# Patient Record
Sex: Male | Born: 1960 | Race: White | Hispanic: No | Marital: Single | Smoking: Current some day smoker
Health system: Southern US, Community
[De-identification: ages and names within clinical notes are randomized; demographics above are authoritative.]

## PROBLEM LIST (undated history)

## (undated) DIAGNOSIS — J45909 Unspecified asthma, uncomplicated: Secondary | ICD-10-CM

## (undated) DIAGNOSIS — F329 Major depressive disorder, single episode, unspecified: Secondary | ICD-10-CM

## (undated) DIAGNOSIS — I10 Essential (primary) hypertension: Secondary | ICD-10-CM

## (undated) DIAGNOSIS — R519 Headache, unspecified: Secondary | ICD-10-CM

## (undated) DIAGNOSIS — J449 Chronic obstructive pulmonary disease, unspecified: Secondary | ICD-10-CM

## (undated) DIAGNOSIS — F419 Anxiety disorder, unspecified: Secondary | ICD-10-CM

## (undated) DIAGNOSIS — F32A Depression, unspecified: Secondary | ICD-10-CM

## (undated) DIAGNOSIS — R51 Headache: Secondary | ICD-10-CM

## (undated) HISTORY — DX: Anxiety disorder, unspecified: F41.9

## (undated) HISTORY — PX: HERNIA REPAIR: SHX51

## (undated) HISTORY — PX: FINGER SURGERY: SHX640

---
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
## 1979-03-30 HISTORY — PX: MANDIBLE FRACTURE SURGERY: SHX706

## 1997-11-06 ENCOUNTER — Emergency Department (HOSPITAL_COMMUNITY): Admission: EM | Admit: 1997-11-06 | Discharge: 1997-11-06 | Payer: Self-pay

## 1998-04-08 ENCOUNTER — Encounter: Payer: Self-pay | Admitting: Emergency Medicine

## 1998-04-08 ENCOUNTER — Observation Stay (HOSPITAL_COMMUNITY): Admission: EM | Admit: 1998-04-08 | Discharge: 1998-04-09 | Payer: Self-pay | Admitting: Emergency Medicine

## 1999-01-25 ENCOUNTER — Emergency Department (HOSPITAL_COMMUNITY): Admission: EM | Admit: 1999-01-25 | Discharge: 1999-01-25 | Payer: Self-pay | Admitting: Emergency Medicine

## 2001-10-21 ENCOUNTER — Emergency Department (HOSPITAL_COMMUNITY): Admission: EM | Admit: 2001-10-21 | Discharge: 2001-10-21 | Payer: Self-pay | Admitting: Internal Medicine

## 2001-10-21 ENCOUNTER — Encounter: Payer: Self-pay | Admitting: Internal Medicine

## 2006-04-03 ENCOUNTER — Emergency Department (HOSPITAL_COMMUNITY): Admission: EM | Admit: 2006-04-03 | Discharge: 2006-04-03 | Payer: Self-pay | Admitting: Emergency Medicine

## 2006-04-04 ENCOUNTER — Emergency Department (HOSPITAL_COMMUNITY): Admission: EM | Admit: 2006-04-04 | Discharge: 2006-04-04 | Payer: Self-pay | Admitting: Family Medicine

## 2006-04-05 ENCOUNTER — Emergency Department (HOSPITAL_COMMUNITY): Admission: EM | Admit: 2006-04-05 | Discharge: 2006-04-05 | Payer: Self-pay | Admitting: Family Medicine

## 2007-05-21 ENCOUNTER — Emergency Department (HOSPITAL_COMMUNITY): Admission: EM | Admit: 2007-05-21 | Discharge: 2007-05-21 | Payer: Self-pay | Admitting: Emergency Medicine

## 2008-09-05 ENCOUNTER — Emergency Department (HOSPITAL_COMMUNITY): Admission: EM | Admit: 2008-09-05 | Discharge: 2008-09-05 | Payer: Self-pay | Admitting: Emergency Medicine

## 2008-09-12 ENCOUNTER — Emergency Department (HOSPITAL_COMMUNITY): Admission: EM | Admit: 2008-09-12 | Discharge: 2008-09-12 | Payer: Self-pay | Admitting: Emergency Medicine

## 2009-10-20 ENCOUNTER — Emergency Department (HOSPITAL_COMMUNITY): Admission: EM | Admit: 2009-10-20 | Discharge: 2009-10-21 | Payer: Self-pay | Admitting: Emergency Medicine

## 2010-06-13 LAB — DIFFERENTIAL
Basophils Absolute: 0.1 10*3/uL (ref 0.0–0.1)
Basophils Relative: 1 % (ref 0–1)
Eosinophils Absolute: 0.3 10*3/uL (ref 0.0–0.7)
Monocytes Absolute: 0.9 10*3/uL (ref 0.1–1.0)
Monocytes Relative: 10 % (ref 3–12)
Neutro Abs: 5.2 10*3/uL (ref 1.7–7.7)
Neutrophils Relative %: 57 % (ref 43–77)

## 2010-06-13 LAB — URINE CULTURE
Culture  Setup Time: 201107260229
Culture: NO GROWTH

## 2010-06-13 LAB — URINALYSIS, ROUTINE W REFLEX MICROSCOPIC
Bilirubin Urine: NEGATIVE
Ketones, ur: NEGATIVE mg/dL
Nitrite: NEGATIVE
Protein, ur: NEGATIVE mg/dL
Urobilinogen, UA: 0.2 mg/dL (ref 0.0–1.0)

## 2010-06-13 LAB — POCT I-STAT, CHEM 8
BUN: 7 mg/dL (ref 6–23)
Calcium, Ion: 1.03 mmol/L — ABNORMAL LOW (ref 1.12–1.32)
Creatinine, Ser: 1.2 mg/dL (ref 0.4–1.5)
Hemoglobin: 15.6 g/dL (ref 13.0–17.0)
Sodium: 143 mEq/L (ref 135–145)
TCO2: 25 mmol/L (ref 0–100)

## 2010-06-13 LAB — CBC
MCH: 33.7 pg (ref 26.0–34.0)
MCHC: 34.7 g/dL (ref 30.0–36.0)
RDW: 12.5 % (ref 11.5–15.5)

## 2010-07-06 LAB — CULTURE, ROUTINE-ABSCESS

## 2010-12-06 ENCOUNTER — Emergency Department (HOSPITAL_COMMUNITY): Payer: Self-pay

## 2010-12-06 ENCOUNTER — Emergency Department (HOSPITAL_COMMUNITY)
Admission: EM | Admit: 2010-12-06 | Discharge: 2010-12-06 | Disposition: A | Discharge status: Home / Self Care | Payer: Self-pay | Attending: Emergency Medicine | Admitting: Emergency Medicine

## 2010-12-06 DIAGNOSIS — R609 Edema, unspecified: Secondary | ICD-10-CM | POA: Insufficient documentation

## 2010-12-06 DIAGNOSIS — N289 Disorder of kidney and ureter, unspecified: Secondary | ICD-10-CM | POA: Insufficient documentation

## 2010-12-06 DIAGNOSIS — R109 Unspecified abdominal pain: Secondary | ICD-10-CM | POA: Insufficient documentation

## 2010-12-06 DIAGNOSIS — R112 Nausea with vomiting, unspecified: Secondary | ICD-10-CM | POA: Insufficient documentation

## 2010-12-06 DIAGNOSIS — J90 Pleural effusion, not elsewhere classified: Secondary | ICD-10-CM | POA: Insufficient documentation

## 2010-12-06 DIAGNOSIS — J189 Pneumonia, unspecified organism: Secondary | ICD-10-CM | POA: Insufficient documentation

## 2010-12-06 DIAGNOSIS — R350 Frequency of micturition: Secondary | ICD-10-CM | POA: Insufficient documentation

## 2010-12-06 DIAGNOSIS — M545 Low back pain, unspecified: Secondary | ICD-10-CM | POA: Insufficient documentation

## 2010-12-06 LAB — BASIC METABOLIC PANEL
BUN: 11 mg/dL (ref 6–23)
Chloride: 100 mEq/L (ref 96–112)
GFR calc Af Amer: >60 mL/min (ref >60)
GFR calc non Af Amer: >60 mL/min (ref >60)
Potassium: 3.5 mEq/L (ref 3.5–5.1)
Sodium: 134 mEq/L — ABNORMAL LOW (ref 135–145)

## 2010-12-06 LAB — URINALYSIS, ROUTINE W REFLEX MICROSCOPIC
Leukocytes, UA: NEGATIVE
Nitrite: NEGATIVE
Specific Gravity, Urine: 1.022 (ref 1.005–1.030)
Urobilinogen, UA: 1 mg/dL (ref 0.0–1.0)
pH: 6 (ref 5.0–8.0)

## 2010-12-08 LAB — URINE CULTURE
Colony Count: 30000
Culture  Setup Time: 201209092007

## 2010-12-21 LAB — DIFFERENTIAL
Basophils Relative: 0
Eosinophils Absolute: 0.3
Eosinophils Relative: 3
Lymphs Abs: 2.2

## 2010-12-21 LAB — CBC
HCT: 43.3
MCHC: 34.4
MCV: 93.8
Platelets: 252
WBC: 11.2 — ABNORMAL HIGH

## 2010-12-21 LAB — I-STAT 8, (EC8 V) (CONVERTED LAB)
BUN: 14
Bicarbonate: 21.3
Glucose, Bld: 97
HCT: 46
Operator id: 294511
TCO2: 22
pCO2, Ven: 37.2 — ABNORMAL LOW

## 2012-04-30 ENCOUNTER — Emergency Department (HOSPITAL_COMMUNITY)
Admission: EM | Admit: 2012-04-30 | Discharge: 2012-04-30 | Disposition: A | Discharge status: Home / Self Care | Payer: Self-pay | Attending: Emergency Medicine | Admitting: Emergency Medicine

## 2012-04-30 ENCOUNTER — Encounter (HOSPITAL_COMMUNITY): Payer: Self-pay | Admitting: Emergency Medicine

## 2012-04-30 DIAGNOSIS — J449 Chronic obstructive pulmonary disease, unspecified: Secondary | ICD-10-CM | POA: Insufficient documentation

## 2012-04-30 DIAGNOSIS — R51 Headache: Secondary | ICD-10-CM | POA: Insufficient documentation

## 2012-04-30 DIAGNOSIS — J4489 Other specified chronic obstructive pulmonary disease: Secondary | ICD-10-CM | POA: Insufficient documentation

## 2012-04-30 DIAGNOSIS — R112 Nausea with vomiting, unspecified: Secondary | ICD-10-CM | POA: Insufficient documentation

## 2012-04-30 DIAGNOSIS — Z79899 Other long term (current) drug therapy: Secondary | ICD-10-CM | POA: Insufficient documentation

## 2012-04-30 DIAGNOSIS — F17200 Nicotine dependence, unspecified, uncomplicated: Secondary | ICD-10-CM | POA: Insufficient documentation

## 2012-04-30 DIAGNOSIS — I10 Essential (primary) hypertension: Secondary | ICD-10-CM | POA: Insufficient documentation

## 2012-04-30 HISTORY — DX: Essential (primary) hypertension: I10

## 2012-04-30 HISTORY — DX: Chronic obstructive pulmonary disease, unspecified: J44.9

## 2012-04-30 MED ORDER — KETOROLAC TROMETHAMINE 30 MG/ML IJ SOLN
15.0000 mg | Freq: Once | INTRAMUSCULAR | Status: AC
  Administered 2012-04-30: 15 mg via INTRAVENOUS
  Filled 2012-04-30: qty 1

## 2012-04-30 MED ORDER — HYDROCODONE-ACETAMINOPHEN 5-325 MG PO TABS
2.0000 | ORAL_TABLET | ORAL | Status: DC | PRN
Start: 2012-04-30 — End: 2013-03-07

## 2012-04-30 MED ORDER — DIPHENHYDRAMINE HCL 50 MG/ML IJ SOLN
25.0000 mg | Freq: Once | INTRAMUSCULAR | Status: AC
  Administered 2012-04-30: 25 mg via INTRAVENOUS
  Filled 2012-04-30: qty 1

## 2012-04-30 MED ORDER — DEXAMETHASONE SODIUM PHOSPHATE 10 MG/ML IJ SOLN
10.0000 mg | Freq: Once | INTRAMUSCULAR | Status: AC
  Administered 2012-04-30: 10 mg via INTRAVENOUS
  Filled 2012-04-30 (×2): qty 1

## 2012-04-30 MED ORDER — METOCLOPRAMIDE HCL 10 MG PO TABS: 10.0000 mg | ORAL_TABLET | Freq: Four times a day (QID) | ORAL | Status: DC | PRN

## 2012-04-30 MED ORDER — SODIUM CHLORIDE 0.9 % IV BOLUS (SEPSIS)
2000.0000 mL | Freq: Once | INTRAVENOUS | Status: AC
  Administered 2012-04-30: 2000 mL via INTRAVENOUS

## 2012-04-30 MED ORDER — SODIUM CHLORIDE 0.9 % IV SOLN: INTRAVENOUS | Status: DC

## 2012-04-30 MED ORDER — METOCLOPRAMIDE HCL 5 MG/ML IJ SOLN
10.0000 mg | Freq: Once | INTRAMUSCULAR | Status: AC
  Administered 2012-04-30: 10 mg via INTRAVENOUS
  Filled 2012-04-30: qty 2

## 2012-04-30 MED ORDER — HYDROMORPHONE HCL PF 1 MG/ML IJ SOLN
1.0000 mg | Freq: Once | INTRAMUSCULAR | Status: AC
  Administered 2012-04-30: 1 mg via INTRAVENOUS
  Filled 2012-04-30: qty 1

## 2012-04-30 NOTE — ED Provider Notes (Signed)
History     CSN: [id number]  Arrival date & time 04/30/12  1729   First MD Initiated Contact with Patient 04/30/12 1821      Chief Complaint  Patient presents with  . Headache    (Consider location/radiation/quality/duration/timing/severity/associated sxs/prior treatment) HPI This 52 year old male has a history of severe headaches in the past, he states the last 3 days he had a gradual onset of right-sided throbbing severe headache associated with nausea and multiple episodes per day of nonbloody vomiting. He is no trauma. He denies fever. He denies any change in speech vision swallowing or understanding. He is no focal or lateralizing weakness numbness or incoordination. There is no treatment prior to arrival. He did get a ride to the emergency room. He has not had a headache like this in the last few years. Past Medical History  Diagnosis Date  . COPD (chronic obstructive pulmonary disease)   . Hypertension    severe headaches  History reviewed. No pertinent past surgical history.  No family history on file.  History  Substance Use Topics  . Smoking status: Current Some Day Smoker  . Smokeless tobacco: Not on file  . Alcohol Use: Yes      Review of Systems 10 Systems reviewed and are negative for acute change except as noted in the HPI. Allergies  Review of patient's allergies indicates no known allergies.  Home Medications   Current Outpatient Rx  Name  Route  Sig  Dispense  Refill  . acetaminophen (TYLENOL) 500 MG tablet   Oral   Take 1,500 mg by mouth every 6 (six) hours as needed. For pain         . albuterol (PROVENTIL HFA;VENTOLIN HFA) 108 (90 BASE) MCG/ACT inhaler   Inhalation   Inhale 2 puffs into the lungs every 6 (six) hours as needed. For shortness of breath         . cetirizine (ZYRTEC) 10 MG tablet   Oral   Take 10 mg by mouth daily.         Marland Kitchen ibuprofen (ADVIL,MOTRIN) 200 MG tablet   Oral   Take 400 mg by mouth every 6 (six) hours as  needed. For pain         . HYDROcodone-acetaminophen (NORCO) 5-325 MG per tablet   Oral   Take 2 tablets by mouth every 4 (four) hours as needed for pain.   6 tablet   0   . metoCLOPramide (REGLAN) 10 MG tablet   Oral   Take 1 tablet (10 mg total) by mouth every 6 (six) hours as needed (nausea/headache).   6 tablet   0     BP 107/66  Pulse 60  Temp(Src) 97.6 F (36.4 C) (Oral)  Resp 18  SpO2 97%  Physical Exam  Nursing note and vitals reviewed. Constitutional:       Awake, alert, nontoxic appearance with baseline speech for patient.  HENT:  Head: Atraumatic.  Mouth/Throat: No oropharyngeal exudate.  Eyes: EOM are normal. Pupils are equal, round, and reactive to light. Right eye exhibits no discharge. Left eye exhibits no discharge.  Neck: Neck supple.  Cardiovascular: Normal rate and regular rhythm.   No murmur heard. Pulmonary/Chest: Effort normal and breath sounds normal. No stridor. No respiratory distress. He has no wheezes. He has no rales. He exhibits no tenderness.  Abdominal: Soft. Bowel sounds are normal. He exhibits no mass. There is no tenderness. There is no rebound.  Musculoskeletal: He exhibits no tenderness.  Baseline ROM, moves extremities with no obvious new focal weakness.  Lymphadenopathy:    He has no cervical adenopathy.  Neurological: He is alert.       Awake, alert, cooperative and aware of situation; motor strength bilaterally; sensation normal to light touch bilaterally; peripheral visual fields full to confrontation; no facial asymmetry; tongue midline; major cranial nerves appear intact; no pronator drift, normal finger to nose bilaterally, baseline gait without new ataxia.  Skin: No rash noted.  Psychiatric: He has a normal mood and affect.    ED Course  Procedures (including critical care time) Patient understands and agrees with initial ED impression and plan with expectations set for ED visit.Pt feels improved after observation  and/or treatment in ED.   Labs Reviewed - No data to display No results found.   1. Headache       MDM  I doubt any other EMC precluding discharge at this time including, but not necessarily limited to the following:SAH, CVA, SBI.        Hurman Horn, MD 05/06/12 1302

## 2012-04-30 NOTE — ED Notes (Signed)
I gave the patient a cup of ice and a coke. 

## 2012-04-30 NOTE — ED Notes (Signed)
Onset 3 days ago headache 8/10 throbbing constant with nausea and emesis today.

## 2013-03-07 ENCOUNTER — Emergency Department (HOSPITAL_COMMUNITY)
Admission: EM | Admit: 2013-03-07 | Discharge: 2013-03-07 | Disposition: A | Discharge status: Home / Self Care | Payer: Self-pay | Attending: Emergency Medicine | Admitting: Emergency Medicine

## 2013-03-07 ENCOUNTER — Encounter (HOSPITAL_COMMUNITY): Payer: Self-pay | Admitting: Emergency Medicine

## 2013-03-07 DIAGNOSIS — M5417 Radiculopathy, lumbosacral region: Secondary | ICD-10-CM

## 2013-03-07 DIAGNOSIS — I10 Essential (primary) hypertension: Secondary | ICD-10-CM | POA: Insufficient documentation

## 2013-03-07 DIAGNOSIS — Z79899 Other long term (current) drug therapy: Secondary | ICD-10-CM | POA: Insufficient documentation

## 2013-03-07 DIAGNOSIS — IMO0002 Reserved for concepts with insufficient information to code with codable children: Secondary | ICD-10-CM | POA: Insufficient documentation

## 2013-03-07 DIAGNOSIS — J4489 Other specified chronic obstructive pulmonary disease: Secondary | ICD-10-CM | POA: Insufficient documentation

## 2013-03-07 DIAGNOSIS — F17200 Nicotine dependence, unspecified, uncomplicated: Secondary | ICD-10-CM | POA: Insufficient documentation

## 2013-03-07 DIAGNOSIS — J449 Chronic obstructive pulmonary disease, unspecified: Secondary | ICD-10-CM | POA: Insufficient documentation

## 2013-03-07 MED ORDER — DIAZEPAM 5 MG PO TABS
5.0000 mg | ORAL_TABLET | Freq: Once | ORAL | Status: AC
  Administered 2013-03-07: 5 mg via ORAL
  Filled 2013-03-07: qty 1

## 2013-03-07 MED ORDER — HYDROMORPHONE HCL PF 1 MG/ML IJ SOLN
1.0000 mg | Freq: Once | INTRAMUSCULAR | Status: AC
  Administered 2013-03-07: 1 mg via INTRAMUSCULAR
  Filled 2013-03-07: qty 1

## 2013-03-07 MED ORDER — PREDNISONE 20 MG PO TABS
60.0000 mg | ORAL_TABLET | Freq: Once | ORAL | Status: AC
  Administered 2013-03-07: 60 mg via ORAL
  Filled 2013-03-07: qty 3

## 2013-03-07 MED ORDER — CYCLOBENZAPRINE HCL 10 MG PO TABS: 10.0000 mg | ORAL_TABLET | Freq: Two times a day (BID) | ORAL | Status: DC | PRN

## 2013-03-07 MED ORDER — OXYCODONE-ACETAMINOPHEN 5-325 MG PO TABS
1.0000 | ORAL_TABLET | Freq: Once | ORAL | Status: AC
  Administered 2013-03-07: 1 via ORAL
  Filled 2013-03-07: qty 1

## 2013-03-07 MED ORDER — OXYCODONE-ACETAMINOPHEN 5-325 MG PO TABS: 1.0000 | ORAL_TABLET | ORAL | Status: DC | PRN

## 2013-03-07 MED ORDER — PREDNISONE 10 MG PO TABS: ORAL_TABLET | ORAL | Status: DC

## 2013-03-07 MED ORDER — KETOROLAC TROMETHAMINE 60 MG/2ML IM SOLN
30.0000 mg | Freq: Once | INTRAMUSCULAR | Status: AC
  Administered 2013-03-07: 30 mg via INTRAMUSCULAR
  Filled 2013-03-07: qty 2

## 2013-03-07 NOTE — ED Notes (Signed)
Pt given a coke 

## 2013-03-07 NOTE — ED Notes (Signed)
Presents with lower back with radiation into bilateral legs and knees. Right side worse than left, began after doing yard work today. Reports any movement makes pain worse. Denies numbness and tingling. CMS intact. Ibuprofen has not helped.

## 2013-03-07 NOTE — ED Notes (Signed)
Pt c/o lower back pain and leg pain. Pt was doing yard work and began to have pain radiating down to his legs. Pt states pain is worse on right side, and movement makes pain worse. Pt rates pain 10/10. Pt states he took 800mg  ibuprofen with no relief about 3 1/2 hrs ago.

## 2013-03-07 NOTE — ED Provider Notes (Signed)
CSN: [id number]     Arrival date & time 03/07/13  1839 History  This chart was scribed for Jaynie Crumble, PA-C, working with Shanna Cisco, MD, by Andrew Au, ED Scribe. This patient was seen in room TR09C/TR09C and the patient's care was started at 8:03 PM    Chief Complaint  Patient presents with  . Back Pain    The history is provided by the patient. No language interpreter was used.    HPI Comments: [name] is a 52 y.o. male who presents to the Emergency Department complaining of constant, moderate lower back pain onset suddenly while doing yard work a few hours ago. He denies any trauma or falls, and states that he believes he over exerted himself. He states that his pain radiates down to his bilateral hamstrings (right worse than left) as a "burning" pain. He states that he has a history of a right kidney cyst, but no history of chronic back pain. He states that he has no PCP, and that he does not take pain medications on a regular basis. He denies abdominal pain, bowel or bladder incontinence or any other pain or symptoms. He denies having a history of DM. He states that he did not drive to the ED.   PCP- None   Past Medical History  Diagnosis Date  . COPD (chronic obstructive pulmonary disease)   . Hypertension    History reviewed. No pertinent past surgical history. History reviewed. No pertinent family history. History  Substance Use Topics  . Smoking status: Current Some Day Smoker  . Smokeless tobacco: Not on file  . Alcohol Use: Yes    Review of Systems  Gastrointestinal:       Denies bowel incontinence.  Genitourinary:       Denies bladder incontinence.  Musculoskeletal: Positive for back pain.    Allergies  Doxycycline  Home Medications   Current Outpatient Rx  Name  Route  Sig  Dispense  Refill  . albuterol (PROVENTIL HFA;VENTOLIN HFA) 108 (90 BASE) MCG/ACT inhaler   Inhalation   Inhale 2 puffs into the lungs every 6 (six) hours as  needed. For shortness of breath         . ibuprofen (ADVIL,MOTRIN) 200 MG tablet   Oral   Take 400 mg by mouth every 6 (six) hours as needed. For pain          Triage Vitals BP 119/79  Pulse 81  Temp(Src) 97.7 F (36.5 C) (Oral)  Resp 18  SpO2 96%  Physical Exam  Nursing note and vitals reviewed. Constitutional: He is oriented to person, place, and time. He appears well-developed and well-nourished. No distress.  HENT:  Head: Normocephalic and atraumatic.  Eyes: EOM are normal.  Neck: Neck supple. No tracheal deviation present.  Cardiovascular: Normal rate.   Pulmonary/Chest: Effort normal. No respiratory distress.  Musculoskeletal: Normal range of motion. He exhibits tenderness.  No midline lumbar spine tenderness. Mild right SI joint tenderness. Tender in the right buttock. Pain with right straight leg raise.  Neurological: He is alert and oriented to person, place, and time.  Strength intact with bilateral dorsiflexion and plantar flexion of the feet.   Skin: Skin is warm and dry.  Psychiatric: He has a normal mood and affect. His behavior is normal.    ED Course  Procedures (including critical care time) DIAGNOSTIC STUDIES: Oxygen Saturation is 96% on RA, normal by my interpretation.    COORDINATION OF CARE: 8:07 PM- Discussed plan to  order pain medications in the ED. Pt advised of plan for treatment and pt agrees.  Medications  ketorolac (TORADOL) injection 30 mg (not administered)  oxyCODONE-acetaminophen (PERCOCET/ROXICET) 5-325 MG per tablet 1 tablet (not administered)  diazepam (VALIUM) tablet 5 mg (not administered)  predniSONE (DELTASONE) tablet 60 mg (not administered)    Labs Review Labs Reviewed - No data to display Imaging Review No results found.  EKG Interpretation   None       MDM  No diagnosis found.  Patient with lower back pain radiating to bilateral legs, right worse than left. No signs of cauda equina. Neurovascularly intact.  Lower extremity strength intact bilaterally. Patient is ambulatory. Patient treated emergency department with pain medications. Is feeling better. We'll discharge home with pain medicine, prednisone taper, followup.  Filed Vitals:   03/07/13 1842 03/07/13 2159  BP: 119/79 113/80  Pulse: 81 82  Temp: 97.7 F (36.5 C) 97.4 F (36.3 C)  TempSrc: Oral Oral  Resp: 18 17  SpO2: 96% 99%    I personally performed the services described in this documentation, which was scribed in my presence. The recorded information has been reviewed and is accurate.   Lottie Mussel, PA-C 03/08/13 [phone]

## 2013-03-08 NOTE — ED Provider Notes (Signed)
Medical screening examination/treatment/procedure(s) were performed by non-physician practitioner and as supervising physician I was immediately available for consultation/collaboration.  EKG Interpretation   None         Shanna Cisco, MD 03/08/13 [phone]

## 2013-08-02 ENCOUNTER — Encounter (HOSPITAL_COMMUNITY): Payer: Self-pay | Admitting: Emergency Medicine

## 2013-08-02 ENCOUNTER — Emergency Department (HOSPITAL_COMMUNITY): Payer: Self-pay

## 2013-08-02 ENCOUNTER — Emergency Department (HOSPITAL_COMMUNITY)
Admission: EM | Admit: 2013-08-02 | Discharge: 2013-08-02 | Disposition: A | Discharge status: Home / Self Care | Payer: Self-pay | Attending: Emergency Medicine | Admitting: Emergency Medicine

## 2013-08-02 DIAGNOSIS — R5381 Other malaise: Secondary | ICD-10-CM | POA: Insufficient documentation

## 2013-08-02 DIAGNOSIS — H53149 Visual discomfort, unspecified: Secondary | ICD-10-CM | POA: Insufficient documentation

## 2013-08-02 DIAGNOSIS — L02219 Cutaneous abscess of trunk, unspecified: Secondary | ICD-10-CM | POA: Insufficient documentation

## 2013-08-02 DIAGNOSIS — Z79899 Other long term (current) drug therapy: Secondary | ICD-10-CM | POA: Insufficient documentation

## 2013-08-02 DIAGNOSIS — R5383 Other fatigue: Secondary | ICD-10-CM

## 2013-08-02 DIAGNOSIS — L03319 Cellulitis of trunk, unspecified: Secondary | ICD-10-CM

## 2013-08-02 DIAGNOSIS — L089 Local infection of the skin and subcutaneous tissue, unspecified: Secondary | ICD-10-CM | POA: Insufficient documentation

## 2013-08-02 DIAGNOSIS — R42 Dizziness and giddiness: Secondary | ICD-10-CM | POA: Insufficient documentation

## 2013-08-02 DIAGNOSIS — R51 Headache: Secondary | ICD-10-CM | POA: Insufficient documentation

## 2013-08-02 DIAGNOSIS — R110 Nausea: Secondary | ICD-10-CM | POA: Insufficient documentation

## 2013-08-02 DIAGNOSIS — Z792 Long term (current) use of antibiotics: Secondary | ICD-10-CM | POA: Insufficient documentation

## 2013-08-02 DIAGNOSIS — L0390 Cellulitis, unspecified: Secondary | ICD-10-CM

## 2013-08-02 DIAGNOSIS — F17200 Nicotine dependence, unspecified, uncomplicated: Secondary | ICD-10-CM | POA: Insufficient documentation

## 2013-08-02 DIAGNOSIS — Y929 Unspecified place or not applicable: Secondary | ICD-10-CM | POA: Insufficient documentation

## 2013-08-02 DIAGNOSIS — W57XXXA Bitten or stung by nonvenomous insect and other nonvenomous arthropods, initial encounter: Secondary | ICD-10-CM | POA: Insufficient documentation

## 2013-08-02 DIAGNOSIS — H538 Other visual disturbances: Secondary | ICD-10-CM | POA: Insufficient documentation

## 2013-08-02 DIAGNOSIS — R519 Headache, unspecified: Secondary | ICD-10-CM

## 2013-08-02 DIAGNOSIS — Y939 Activity, unspecified: Secondary | ICD-10-CM | POA: Insufficient documentation

## 2013-08-02 DIAGNOSIS — S20169A Insect bite (nonvenomous) of breast, unspecified breast, initial encounter: Secondary | ICD-10-CM

## 2013-08-02 DIAGNOSIS — F10929 Alcohol use, unspecified with intoxication, unspecified: Secondary | ICD-10-CM

## 2013-08-02 DIAGNOSIS — J4489 Other specified chronic obstructive pulmonary disease: Secondary | ICD-10-CM | POA: Insufficient documentation

## 2013-08-02 DIAGNOSIS — F1010 Alcohol abuse, uncomplicated: Secondary | ICD-10-CM | POA: Insufficient documentation

## 2013-08-02 DIAGNOSIS — I10 Essential (primary) hypertension: Secondary | ICD-10-CM | POA: Insufficient documentation

## 2013-08-02 DIAGNOSIS — J449 Chronic obstructive pulmonary disease, unspecified: Secondary | ICD-10-CM | POA: Insufficient documentation

## 2013-08-02 LAB — I-STAT CHEM 8, ED
BUN: 7 mg/dL (ref 6–23)
CALCIUM ION: 1 mmol/L — AB (ref 1.12–1.23)
CHLORIDE: 101 meq/L (ref 96–112)
Creatinine, Ser: 1.3 mg/dL (ref 0.50–1.35)
GLUCOSE: 101 mg/dL — AB (ref 70–99)
HCT: 46 % (ref 39.0–52.0)
HEMOGLOBIN: 15.6 g/dL (ref 13.0–17.0)
POTASSIUM: 3.9 meq/L (ref 3.7–5.3)
Sodium: 141 mEq/L (ref 137–147)
TCO2: 23 mmol/L (ref 0–100)

## 2013-08-02 LAB — CBC
HCT: 42.1 % (ref 39.0–52.0)
HEMOGLOBIN: 14.7 g/dL (ref 13.0–17.0)
MCH: 32.6 pg (ref 26.0–34.0)
MCHC: 34.9 g/dL (ref 30.0–36.0)
MCV: 93.3 fL (ref 78.0–100.0)
PLATELETS: 228 10*3/uL (ref 150–400)
RBC: 4.51 MIL/uL (ref 4.22–5.81)
RDW: 13 % (ref 11.5–15.5)
WBC: 4.8 10*3/uL (ref 4.0–10.5)

## 2013-08-02 LAB — DIFFERENTIAL
BASOS ABS: 0 10*3/uL (ref 0.0–0.1)
BASOS PCT: 1 % (ref 0–1)
Eosinophils Absolute: 0.2 10*3/uL (ref 0.0–0.7)
Eosinophils Relative: 4 % (ref 0–5)
LYMPHS PCT: 35 % (ref 12–46)
Lymphs Abs: 1.6 10*3/uL (ref 0.7–4.0)
Monocytes Absolute: 0.5 10*3/uL (ref 0.1–1.0)
Monocytes Relative: 12 % (ref 3–12)
NEUTROS PCT: 48 % (ref 43–77)
Neutro Abs: 2.2 10*3/uL (ref 1.7–7.7)

## 2013-08-02 LAB — BASIC METABOLIC PANEL
BUN: 9 mg/dL (ref 6–23)
CALCIUM: 8.1 mg/dL — AB (ref 8.4–10.5)
CO2: 22 mEq/L (ref 19–32)
Chloride: 101 mEq/L (ref 96–112)
Creatinine, Ser: 0.8 mg/dL (ref 0.50–1.35)
GLUCOSE: 99 mg/dL (ref 70–99)
Potassium: 4.2 mEq/L (ref 3.7–5.3)
SODIUM: 138 meq/L (ref 137–147)

## 2013-08-02 LAB — ETHANOL: Alcohol, Ethyl (B): 276 mg/dL — ABNORMAL HIGH (ref 0–11)

## 2013-08-02 LAB — I-STAT TROPONIN, ED: Troponin i, poc: 0 ng/mL (ref 0.00–0.08)

## 2013-08-02 LAB — PROTIME-INR
INR: 0.95 (ref 0.00–1.49)
PROTHROMBIN TIME: 12.5 s (ref 11.6–15.2)

## 2013-08-02 LAB — APTT: aPTT: 27 seconds (ref 24–37)

## 2013-08-02 MED ORDER — DOXYCYCLINE HYCLATE 100 MG PO CAPS: 100.0000 mg | ORAL_CAPSULE | Freq: Two times a day (BID) | ORAL | Status: AC

## 2013-08-02 MED ORDER — PROCHLORPERAZINE EDISYLATE 5 MG/ML IJ SOLN
10.0000 mg | Freq: Once | INTRAMUSCULAR | Status: AC
  Administered 2013-08-02: 10 mg via INTRAVENOUS
  Filled 2013-08-02: qty 2

## 2013-08-02 MED ORDER — DOXYCYCLINE HYCLATE 100 MG PO TABS
100.0000 mg | ORAL_TABLET | Freq: Once | ORAL | Status: AC
  Administered 2013-08-02: 100 mg via ORAL
  Filled 2013-08-02: qty 1

## 2013-08-02 MED ORDER — DEXAMETHASONE SODIUM PHOSPHATE 10 MG/ML IJ SOLN
10.0000 mg | Freq: Once | INTRAMUSCULAR | Status: DC
  Filled 2013-08-02: qty 1

## 2013-08-02 MED ORDER — MORPHINE SULFATE 4 MG/ML IJ SOLN
4.0000 mg | Freq: Once | INTRAMUSCULAR | Status: AC
  Administered 2013-08-02: 4 mg via INTRAVENOUS
  Filled 2013-08-02: qty 1

## 2013-08-02 MED ORDER — DEXAMETHASONE SODIUM PHOSPHATE 10 MG/ML IJ SOLN
10.0000 mg | Freq: Once | INTRAMUSCULAR | Status: AC
  Administered 2013-08-02: 10 mg via INTRAMUSCULAR

## 2013-08-02 MED ORDER — DIPHENHYDRAMINE HCL 50 MG/ML IJ SOLN
25.0000 mg | Freq: Once | INTRAMUSCULAR | Status: AC
  Administered 2013-08-02: 25 mg via INTRAVENOUS
  Filled 2013-08-02: qty 1

## 2013-08-02 NOTE — ED Notes (Signed)
Patient received from triage, complains of headache blurred vision, unsteady gate, argumentative and seems confused but is able to be reoriented.  Complains of also having insect bites on his trunk.  Pt has been asked to put a gown on and remove clothing.  Patient has placed a gown on , then patient will change back into clothing and not stay in gown.

## 2013-08-02 NOTE — ED Notes (Signed)
[name] made aware of pt's symptoms. Pt is appropriate for fast track per PA.

## 2013-08-02 NOTE — ED Notes (Signed)
Patient made aware to come back if having any itching or swelling. Patient asked to leave. Made aware of possible problems. MD verified it was okay.

## 2013-08-02 NOTE — ED Provider Notes (Signed)
CSN: [id number]     Arrival date & time 08/02/13  1748 History   First MD Initiated Contact with Patient 08/02/13 1843     Chief Complaint  Patient presents with  . Insect Bite  . Headache  . Nausea  . Blurred Vision     (Consider location/radiation/quality/duration/timing/severity/associated sxs/prior Treatment) Patient is a 53 y.o. male presenting with headaches. The history is provided by the patient.  Headache Pain location:  Generalized Quality:  Dull Radiates to:  Does not radiate Onset quality:  Gradual Duration:  1 day Timing:  Constant Progression:  Worsening Chronicity:  New Similar to prior headaches: yes   Context: activity and bright light   Relieved by:  Nothing Worsened by:  Nothing tried Ineffective treatments:  None tried Associated symptoms: blurred vision, dizziness, myalgias and photophobia   Associated symptoms: no abdominal pain, no fever, no neck stiffness, no sinus pressure, no syncope and no vomiting     Past Medical History  Diagnosis Date  . COPD (chronic obstructive pulmonary disease)   . Hypertension    History reviewed. No pertinent past surgical history. History reviewed. No pertinent family history. History  Substance Use Topics  . Smoking status: Current Some Day Smoker  . Smokeless tobacco: Not on file  . Alcohol Use: Yes     Comment: daily    Review of Systems  Constitutional: Negative for fever.  HENT: Negative for sinus pressure.   Eyes: Positive for blurred vision and photophobia.  Cardiovascular: Negative for syncope.  Gastrointestinal: Negative for vomiting and abdominal pain.  Musculoskeletal: Positive for myalgias. Negative for neck stiffness.  Neurological: Positive for dizziness and headaches.  All other systems reviewed and are negative.     Allergies  Doxycycline  Home Medications   Prior to Admission medications   Medication Sig Start Date End Date Taking? Authorizing Provider  albuterol (PROVENTIL  HFA;VENTOLIN HFA) 108 (90 BASE) MCG/ACT inhaler Inhale 2 puffs into the lungs every 6 (six) hours as needed. For shortness of breath   Yes Historical Provider, MD  doxycycline (VIBRAMYCIN) 100 MG capsule Take 1 capsule (100 mg total) by mouth 2 (two) times daily. 08/03/13 08/16/13  Lyndal Pulleyaniel Tanish Sinkler, MD   BP 129/116  Pulse 76  Temp(Src) 98.2 F (36.8 C) (Oral)  Resp 18  Ht 5\' 7"  (1.702 m)  Wt 176 lb (79.833 kg)  BMI 27.56 kg/m2  SpO2 91% Physical Exam  Constitutional: He is oriented to person, place, and time. He appears well-developed and well-nourished. No distress.  HENT:  Head: Normocephalic and atraumatic.  Eyes: Conjunctivae are normal.  Neck: Neck supple. No tracheal deviation present.  Cardiovascular: Normal rate and regular rhythm.   Pulmonary/Chest: Effort normal. No respiratory distress.  Abdominal: Soft. He exhibits no distension.  Neurological: He is alert and oriented to person, place, and time.  Skin: Skin is warm and dry. Lesion (tick bite sites, frontal site with overlying erythema and mild induration without fluctuance, drainage, or evidence of abscess ) noted.     Psychiatric: He has a normal mood and affect.    ED Course  Procedures (including critical care time) Labs Review Labs Reviewed  BASIC METABOLIC PANEL - Abnormal; Notable for the following:    Calcium 8.1 (*)    All other components within normal limits  ETHANOL - Abnormal; Notable for the following:    Alcohol, Ethyl (B) 276 (*)    All other components within normal limits  I-STAT CHEM 8, ED - Abnormal; Notable for the following:  Glucose, Bld 101 (*)    Calcium, Ion 1.00 (*)    All other components within normal limits  CBC  PROTIME-INR  APTT  DIFFERENTIAL  B. BURGDORFI ANTIBODIES  ROCKY MTN SPOTTED FVR AB, IGG-BLOOD  ROCKY MTN SPOTTED FVR AB, IGM-BLOOD  URINE RAPID DRUG SCREEN (HOSP PERFORMED)  URINALYSIS, ROUTINE W REFLEX MICROSCOPIC  I-STAT TROPOININ, ED  I-STAT TROPOININ, ED     Imaging Review Ct Head Wo Contrast  08/02/2013   CLINICAL DATA:  Headache, blurred vision, unsteady gait. Headache and nausea. Insect bite.  EXAM: CT HEAD WITHOUT CONTRAST  TECHNIQUE: Contiguous axial images were obtained from the base of the skull through the vertex without intravenous contrast.  COMPARISON:  Head CT 10/21/2001  FINDINGS: No acute intracranial abnormality is identified. Specifically, negative for intra or extra-axial hemorrhage, hydrocephalus, mass effect, mass lesion, or evidence of acute cortically based infarction.  There is thick circumferential mucosal thickening of the left maxillary sinus. The right maxillary sinus is clear. There is mucosal thickening of the frontal sinus and scattered ethmoid air cells. No air-fluid levels in sinuses. Mastoid air cells are clear. Partially visualized periapical lucency over right maxillary tooth.  IMPRESSION: 1. No acute cardiopulmonary disease. 2. Chronic sinus disease. 3. Periapical lucency of a right maxillary tooth, partially visualized.   Electronically Signed   By: Britta MccreedySusan  Turner M.D.   On: 08/02/2013 20:56     EKG Interpretation None      MDM   Final diagnoses:  Headache  Alcohol intoxication  Tick bites  Cellulitis    53 y.o. male presents with headache starting today first noticed when he woke up. He states he felt poorly this morning like he was shaking and warm. Pt has heavy daily alcohol use and has drank his normal amount today. He is concerned because he has had 3 tick bites, the worst had 2 ticks attached to his abdomen after fishing 4 days ago. He had a third tick attached to his right flank. No neurologic signs, no neck stiffness, no rashes on arrival. Does have some surrounding erythema and induration at site of bite concerning for cellulitis. Headache responds to migraine cocktail and no acute findings on head CT. Not hyponatremic despite heavy daily beer consumption, labs not suggestive of RMSF. Doubt meningitis,  headache resolved after migraine cocktail and no fever currently. Due to regional prevalence of RMSF will treat cellulitis with doxycyline to double-cover for tick-borne illness. Stated intolerance to doxy- itching, discussed with Pt that he may return to the ED with symptoms of allergic reaction or anaphylaxis but that benefits of therapy likely outweigh risks given mild side effect.    Lyndal Pulleyaniel Key Cen, MD 08/02/13 2226

## 2013-08-02 NOTE — Discharge Instructions (Signed)
Tick Bite Information Ticks are insects that attach themselves to the skin and draw blood for food. There are various types of ticks. Common types include wood ticks and deer ticks. Most ticks live in shrubs and grassy areas. Ticks can climb onto your body when you make contact with leaves or grass where the tick is waiting. The most common places on the body for ticks to attach themselves are the scalp, neck, armpits, waist, and groin. Most tick bites are harmless, but sometimes ticks carry germs that cause diseases. These germs can be spread to a person during the tick's feeding process. The chance of a disease spreading through a tick bite depends on:   The type of tick.  Time of year.   How long the tick is attached.   Geographic location.  HOW CAN YOU PREVENT TICK BITES? Take these steps to help prevent tick bites when you are outdoors:  Wear protective clothing. Long sleeves and long pants are best.   Wear white clothes so you can see ticks more easily.  Tuck your pant legs into your socks.   If walking on a trail, stay in the middle of the trail to avoid brushing against bushes.  Avoid walking through areas with long grass.  Put insect repellent on all exposed skin and along boot tops, pant legs, and sleeve cuffs.   Check clothing, hair, and skin repeatedly and before going inside.   Brush off any ticks that are not attached.  Take a shower or bath as soon as possible after being outdoors.  WHAT IS THE PROPER WAY TO REMOVE A TICK? Ticks should be removed as soon as possible to help prevent diseases caused by tick bites. 1. If latex gloves are available, put them on before trying to remove a tick.  2. Using fine-point tweezers, grasp the tick as close to the skin as possible. You may also use curved forceps or a tick removal tool. Grasp the tick as close to its head as possible. Avoid grasping the tick on its body. 3. Pull gently with steady upward pressure until  the tick lets go. Do not twist the tick or jerk it suddenly. This may break off the tick's head or mouth parts. 4. Do not squeeze or crush the tick's body. This could force disease-carrying fluids from the tick into your body.  5. After the tick is removed, wash the bite area and your hands with soap and water or other disinfectant such as alcohol. 6. Apply a small amount of antiseptic cream or ointment to the bite site.  7. Wash and disinfect any instruments that were used.  Do not try to remove a tick by applying a hot match, petroleum jelly, or fingernail polish to the tick. These methods do not work and may increase the chances of disease being spread from the tick bite.  WHEN SHOULD YOU SEEK MEDICAL CARE? Contact your health care provider if you are unable to remove a tick from your skin or if a part of the tick breaks off and is stuck in the skin.  After a tick bite, you need to be aware of signs and symptoms that could be related to diseases spread by ticks. Contact your health care provider if you develop any of the following in the days or weeks after the tick bite:  Unexplained fever.  Rash. A circular rash that appears days or weeks after the tick bite may indicate the possibility of Lyme disease. The rash may resemble  a target with a bull's-eye and may occur at a different part of your body than the tick bite.  Redness and swelling in the area of the tick bite.   Tender, swollen lymph glands.   Diarrhea.   Weight loss.   Cough.   Fatigue.   Muscle, joint, or bone pain.   Abdominal pain.   Headache.   Lethargy or a change in your level of consciousness.  Difficulty walking or moving your legs.   Numbness in the legs.   Paralysis.  Shortness of breath.   Confusion.   Repeated vomiting.  Document Released: 03/12/2000 Document Revised: 01/03/2013 Document Reviewed: 08/23/2012 [facility] Patient Information 2014 [place],  [place]. Cellulitis Cellulitis is an infection of the skin and the tissue beneath it. The infected area is usually red and tender. Cellulitis occurs most often in the arms and lower legs.  CAUSES  Cellulitis is caused by bacteria that enter the skin through cracks or cuts in the skin. The most common types of bacteria that cause cellulitis are Staphylococcus and Streptococcus. SYMPTOMS   Redness and warmth.  Swelling.  Tenderness or pain.  Fever. DIAGNOSIS  Your caregiver can usually determine what is wrong based on a physical exam. Blood tests may also be done. TREATMENT  Treatment usually involves taking an antibiotic medicine. HOME CARE INSTRUCTIONS   Take your antibiotics as directed. Finish them even if you start to feel better.  Keep the infected arm or leg elevated to reduce swelling.  Apply a warm cloth to the affected area up to 4 times per day to relieve pain.  Only take over-the-counter or prescription medicines for pain, discomfort, or fever as directed by your caregiver.  Keep all follow-up appointments as directed by your caregiver. SEEK MEDICAL CARE IF:   You notice red streaks coming from the infected area.  Your red area gets larger or turns dark in color.  Your bone or joint underneath the infected area becomes painful after the skin has healed.  Your infection returns in the same area or another area.  You notice a swollen bump in the infected area.  You develop new symptoms. SEEK IMMEDIATE MEDICAL CARE IF:   You have a fever.  You feel very sleepy.  You develop vomiting or diarrhea.  You have a general ill feeling (malaise) with muscle aches and pains. MAKE SURE YOU:   Understand these instructions.  Will watch your condition.  Will get help right away if you are not doing well or get worse. Document Released: 12/23/2004 Document Revised: 09/14/2011 Document Reviewed: 05/31/2011 [facility] Patient Information 2014 [place], [place].

## 2013-08-02 NOTE — ED Notes (Addendum)
Pt reports gradually worsening "entire head" HA x AM with nausea. Hx migraine. States "I found three ticks on me two days ago and ever since then I have felt rough." Red marks noted to abdomen from insect bites. Reports feeling "clammy". AO x4, ambulatory to triage.

## 2013-08-02 NOTE — ED Provider Notes (Signed)
MSE was initiated and I personally evaluated the patient and placed orders (if any) at  6:55 PM on Aug 02, 2013.  The patient appears stable so that the remainder of the MSE may be completed by another provider.  Patient presents to the emergency department with chief complaint of new onset headache that began this morning. Patient states that he has had blurred vision, and a pounding headache. He denies any history of headaches. Additionally complains that he has pulled out several ticks recently. He denies any fevers chills.  Will order basic labs, and move to an acute bed.  Roxy Horsemanobert Erbie Arment, PA-C 08/02/13 2213

## 2013-08-03 LAB — ROCKY MTN SPOTTED FVR AB, IGG-BLOOD: RMSF IGG: 0.34 IV

## 2013-08-03 LAB — B. BURGDORFI ANTIBODIES: B burgdorferi Ab IgG+IgM: 0.13 {ISR}

## 2013-08-03 LAB — ROCKY MTN SPOTTED FVR AB, IGM-BLOOD: RMSF IgM: 0.18 IV (ref 0.00–0.89)

## 2013-08-03 NOTE — ED Provider Notes (Signed)
I saw and evaluated the patient, reviewed the resident's note and I agree with the findings and plan.   EKG Interpretation None      Will cover for RMSF given tick bite and HA. Cellulitis will be treated as well  Lyanne CoKevin M Chaunte Hornbeck, MD 08/03/13 0100

## 2013-08-06 NOTE — ED Provider Notes (Signed)
Medical screening examination/treatment/procedure(s) were performed by non-physician practitioner and as supervising physician I was immediately available for consultation/collaboration.   EKG Interpretation   Date/Time:  Thursday Aug 02 2013 19:28:36 EDT Ventricular Rate:  87 PR Interval:  124 QRS Duration: 87 QT Interval:  354 QTC Calculation: 426 R Axis:   84 Text Interpretation:  Sinus rhythm ST elev, probable normal early repol  pattern ED PHYSICIAN INTERPRETATION AVAILABLE IN [facility] Confirmed  by TEST, Record ([id number]) on 08/04/2013 1:24:20 PM        Shelda JakesScott W. Hudsyn Champine, MD 08/06/13 [phone]

## 2013-09-21 ENCOUNTER — Emergency Department (HOSPITAL_COMMUNITY)
Admission: EM | Admit: 2013-09-21 | Discharge: 2013-09-21 | Disposition: A | Discharge status: Home / Self Care | Payer: Self-pay | Attending: Emergency Medicine | Admitting: Emergency Medicine

## 2013-09-21 ENCOUNTER — Encounter (HOSPITAL_COMMUNITY): Payer: Self-pay | Admitting: Emergency Medicine

## 2013-09-21 ENCOUNTER — Emergency Department (HOSPITAL_COMMUNITY): Payer: Self-pay

## 2013-09-21 DIAGNOSIS — S0100XA Unspecified open wound of scalp, initial encounter: Secondary | ICD-10-CM | POA: Insufficient documentation

## 2013-09-21 DIAGNOSIS — S01312A Laceration without foreign body of left ear, initial encounter: Secondary | ICD-10-CM

## 2013-09-21 DIAGNOSIS — S01309A Unspecified open wound of unspecified ear, initial encounter: Secondary | ICD-10-CM | POA: Insufficient documentation

## 2013-09-21 DIAGNOSIS — S0990XA Unspecified injury of head, initial encounter: Secondary | ICD-10-CM

## 2013-09-21 DIAGNOSIS — J449 Chronic obstructive pulmonary disease, unspecified: Secondary | ICD-10-CM | POA: Insufficient documentation

## 2013-09-21 DIAGNOSIS — R52 Pain, unspecified: Secondary | ICD-10-CM | POA: Insufficient documentation

## 2013-09-21 DIAGNOSIS — J4489 Other specified chronic obstructive pulmonary disease: Secondary | ICD-10-CM | POA: Insufficient documentation

## 2013-09-21 DIAGNOSIS — M549 Dorsalgia, unspecified: Secondary | ICD-10-CM | POA: Insufficient documentation

## 2013-09-21 DIAGNOSIS — F17200 Nicotine dependence, unspecified, uncomplicated: Secondary | ICD-10-CM | POA: Insufficient documentation

## 2013-09-21 DIAGNOSIS — I10 Essential (primary) hypertension: Secondary | ICD-10-CM | POA: Insufficient documentation

## 2013-09-21 DIAGNOSIS — S0101XA Laceration without foreign body of scalp, initial encounter: Secondary | ICD-10-CM

## 2013-09-21 MED ORDER — KETOROLAC TROMETHAMINE 30 MG/ML IJ SOLN
60.0000 mg | Freq: Once | INTRAMUSCULAR | Status: AC
  Administered 2013-09-21: 60 mg via INTRAMUSCULAR
  Filled 2013-09-21: qty 2

## 2013-09-21 MED ORDER — HYDROCODONE-ACETAMINOPHEN 5-325 MG PO TABS
1.0000 | ORAL_TABLET | Freq: Once | ORAL | Status: AC
  Administered 2013-09-21: 1 via ORAL
  Filled 2013-09-21: qty 1

## 2013-09-21 MED ORDER — KETOROLAC TROMETHAMINE 30 MG/ML IJ SOLN
INTRAMUSCULAR | Status: AC
  Filled 2013-09-21: qty 1

## 2013-09-21 MED ORDER — TETANUS-DIPHTH-ACELL PERTUSSIS 5-2.5-18.5 LF-MCG/0.5 IM SUSP
0.5000 mL | Freq: Once | INTRAMUSCULAR | Status: AC
  Administered 2013-09-21: 0.5 mL via INTRAMUSCULAR
  Filled 2013-09-21: qty 0.5

## 2013-09-21 MED ORDER — ACETAMINOPHEN 325 MG PO TABS
650.0000 mg | ORAL_TABLET | Freq: Once | ORAL | Status: AC
  Administered 2013-09-21: 650 mg via ORAL
  Filled 2013-09-21: qty 2

## 2013-09-21 NOTE — Discharge Instructions (Signed)
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
## 2013-09-21 NOTE — ED Notes (Signed)
MD at bedside. 
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
## 2013-09-23 ENCOUNTER — Emergency Department (HOSPITAL_COMMUNITY)
Admission: EM | Admit: 2013-09-23 | Discharge: 2013-09-23 | Disposition: A | Discharge status: Home / Self Care | Payer: Self-pay | Attending: Emergency Medicine | Admitting: Emergency Medicine

## 2013-09-23 ENCOUNTER — Emergency Department (HOSPITAL_COMMUNITY): Payer: Self-pay

## 2013-09-23 ENCOUNTER — Encounter (HOSPITAL_COMMUNITY): Payer: Self-pay | Admitting: Emergency Medicine

## 2013-09-23 DIAGNOSIS — R110 Nausea: Secondary | ICD-10-CM | POA: Insufficient documentation

## 2013-09-23 DIAGNOSIS — J4489 Other specified chronic obstructive pulmonary disease: Secondary | ICD-10-CM | POA: Insufficient documentation

## 2013-09-23 DIAGNOSIS — R0781 Pleurodynia: Secondary | ICD-10-CM

## 2013-09-23 DIAGNOSIS — R05 Cough: Secondary | ICD-10-CM | POA: Insufficient documentation

## 2013-09-23 DIAGNOSIS — R059 Cough, unspecified: Secondary | ICD-10-CM | POA: Insufficient documentation

## 2013-09-23 DIAGNOSIS — R079 Chest pain, unspecified: Secondary | ICD-10-CM | POA: Insufficient documentation

## 2013-09-23 DIAGNOSIS — Z79899 Other long term (current) drug therapy: Secondary | ICD-10-CM | POA: Insufficient documentation

## 2013-09-23 DIAGNOSIS — G8911 Acute pain due to trauma: Secondary | ICD-10-CM | POA: Insufficient documentation

## 2013-09-23 DIAGNOSIS — J449 Chronic obstructive pulmonary disease, unspecified: Secondary | ICD-10-CM | POA: Insufficient documentation

## 2013-09-23 DIAGNOSIS — I10 Essential (primary) hypertension: Secondary | ICD-10-CM | POA: Insufficient documentation

## 2013-09-23 DIAGNOSIS — F17200 Nicotine dependence, unspecified, uncomplicated: Secondary | ICD-10-CM | POA: Insufficient documentation

## 2013-09-23 MED ORDER — IBUPROFEN 400 MG PO TABS
800.0000 mg | ORAL_TABLET | Freq: Once | ORAL | Status: AC
  Administered 2013-09-23: 800 mg via ORAL
  Filled 2013-09-23: qty 2

## 2013-09-23 MED ORDER — HYDROCODONE-ACETAMINOPHEN 5-325 MG PO TABS
2.0000 | ORAL_TABLET | Freq: Once | ORAL | Status: AC
  Administered 2013-09-23: 2 via ORAL
  Filled 2013-09-23: qty 2

## 2013-09-23 MED ORDER — NAPROXEN 500 MG PO TABS: 500.0000 mg | ORAL_TABLET | Freq: Two times a day (BID) | ORAL | Status: DC

## 2013-09-23 MED ORDER — CYCLOBENZAPRINE HCL 10 MG PO TABS
5.0000 mg | ORAL_TABLET | Freq: Once | ORAL | Status: AC
  Administered 2013-09-23: 5 mg via ORAL
  Filled 2013-09-23: qty 1

## 2013-09-23 MED ORDER — CYCLOBENZAPRINE HCL 5 MG PO TABS: 5.0000 mg | ORAL_TABLET | Freq: Three times a day (TID) | ORAL | Status: DC | PRN

## 2013-09-23 NOTE — Discharge Instructions (Signed)
Take flexeril for muscle spasm - Please be careful with this medication.  It can cause drowsiness.  Use caution while driving, operating machinery, drinking alcohol, or any other activities that may impair your physical or mental abilities.   Take naprosyn twice daily with food for pain  Use incentive spirometer to help keep lungs open (see below) - avoid tobacco use  Return to the emergency department if you develop any changing/worsening condition, coughing up blood, difficulty breathing, fever, chest pain, or any other concerns (please read additional information regarding your condition below)    Rib Contusion A rib contusion (bruise) can occur by a blow to the chest or by a fall against a hard object. Usually these will be much better in a couple weeks. If X-rays were taken today and there are no broken bones (fractures), the diagnosis of bruising is made. However, broken ribs may not show up for several days, or may be discovered later on a routine X-ray when signs of healing show up. If this happens to you, it does not mean that something was missed on the X-ray, but simply that it did not show up on the first X-rays. Earlier diagnosis will not usually change the treatment. HOME CARE INSTRUCTIONS   Avoid strenuous activity. Be careful during activities and avoid bumping the injured ribs. Activities that pull on the injured ribs and cause pain should be avoided, if possible.  For the first day or two, an ice pack used every 20 minutes while awake may be helpful. Put ice in a plastic bag and put a towel between the bag and the skin.  Eat a normal, well-balanced diet. Drink plenty of fluids to avoid constipation.  Take deep breaths several times a day to keep lungs free of infection. Try to cough several times a day. Splint the injured area with a pillow while coughing to ease pain. Coughing can help prevent pneumonia.  Wear a rib belt or binder only if told to do so by your caregiver. If you  are wearing a rib belt or binder, you must do the breathing exercises as directed by your caregiver. If not used properly, rib belts or binders restrict breathing which can lead to pneumonia.  Only take over-the-counter or prescription medicines for pain, discomfort, or fever as directed by your caregiver. SEEK MEDICAL CARE IF:   You or your child has an oral temperature above 102 F (38.9 C).  Your baby is older than 3 months with a rectal temperature of 100.5 F (38.1 C) or higher for more than 1 day.  You develop a cough, with thick or bloody sputum. SEEK IMMEDIATE MEDICAL CARE IF:   You have difficulty breathing.  You feel sick to your stomach (nausea), have vomiting or belly (abdominal) pain.  You have worsening pain, not controlled with medications, or there is a change in the location of the pain.  You develop sweating or radiation of the pain into the arms, jaw or shoulders, or become light headed or faint.  You or your child has an oral temperature above 102 F (38.9 C), not controlled by medicine.  Your or your baby is older than 3 months with a rectal temperature of 102 F (38.9 C) or higher.  Your baby is 183 months old or younger with a rectal temperature of 100.4 F (38 C) or higher. MAKE SURE YOU:   Understand these instructions.  Will watch your condition.  Will get help right away if you are not doing well  or get worse. Document Released: 12/08/2000 Document Revised: 07/10/2012 Document Reviewed: 11/01/2007 [facility] Patient Information 2015 [place]. This information is not intended to replace advice given to you by your health care provider. Make sure you discuss any questions you have with your health care provider.  Incentive Spirometer An incentive spirometer is a tool that can help keep your lungs clear and active. This tool measures how well you are filling your lungs with each breath. Taking long deep breaths may help reverse or decrease the  chance of developing breathing (pulmonary) problems (especially infection) following:  Surgery of the chest or abdomen.  Surgery if you have a history of smoking or a lung problem.  A long period of time when you are unable to move or be active. BEFORE THE PROCEDURE   If the spirometer includes an indictor to show your best effort, your nurse or respiratory therapist will set it to a desired goal.  If possible, sit up straight or lean slightly forward. Try not to slouch.  Hold the incentive spirometer in an upright position. INSTRUCTIONS FOR USE  1. Sit on the edge of your bed if possible, or sit up as far as you can in bed or on a chair. 2. Hold the incentive spirometer in an upright position. 3. Breathe out normally. 4. Place the mouthpiece in your mouth and seal your lips tightly around it. 5. Breathe in slowly and as deeply as possible, raising the piston or the ball toward the top of the column. 6. Hold your breath for 3-5 seconds or for as long as possible. Allow the piston or ball to fall to the bottom of the column. 7. Remove the mouthpiece from your mouth and breathe out normally. 8. Rest for a few seconds and repeat Steps 1 through 7 at least 10 times every 1-2 hours when you are awake. Take your time and take a few normal breaths between deep breaths. 9. The spirometer may include an indicator to show your best effort. Use the indicator as a goal to work toward during each repetition. 10. After each set of 10 deep breaths, practice coughing to be sure your lungs are clear. If you have an incision (the cut made at the time of surgery), support your incision when coughing by placing a pillow or rolled up towels firmly against it. Once you are able to get out of bed, walk around indoors and cough well. You may stop using the incentive spirometer when instructed by your caregiver.  RISKS AND COMPLICATIONS  Breathing too quickly may cause dizziness. At an extreme, this could cause  you to pass out. Take your time so you do not get dizzy or light-headed.  If you are in pain, you may need to take or ask for pain medication before doing incentive spirometry. It is harder to take a deep breath if you are having pain. AFTER USE  Rest and breathe slowly and easily.  It can be helpful to keep track of a log of your progress. Your caregiver can provide you with a simple table to help with this. If you are using the spirometer at home, follow these instructions: SEEK MEDICAL CARE IF:   You are having difficultly using the spirometer.  You have trouble using the spirometer as often as instructed.  Your pain medication is not giving enough relief while using the spirometer.  You develop fever of 100.5 F (38.1 C) or higher. SEEK IMMEDIATE MEDICAL CARE IF:   You cough up  bloody sputum that had not been present before.  You develop fever of 102 F (38.9 C) or greater.  You develop worsening pain at or near the incision site. MAKE SURE YOU:   Understand these instructions.  Will watch your condition.  Will get help right away if you are not doing well or get worse. Document Released: 07/26/2006 Document Revised: 06/07/2011 Document Reviewed: 09/26/2006 [facility] Patient Information 2015 [place], [place]. This information is not intended to replace advice given to you by your health care provider. Make sure you discuss any questions you have with your health care provider.

## 2013-09-23 NOTE — ED Notes (Signed)
Pt now able to stand for xray.  Xray called for pt.

## 2013-09-23 NOTE — ED Notes (Signed)
Patient transported to X-ray 

## 2013-09-23 NOTE — ED Notes (Signed)
Pt back from xray.  Pt unable to tolerate standing or laying for xray.  PA aware and in to see pt.

## 2013-09-23 NOTE — ED Notes (Signed)
Pt to xray
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
## 2013-09-23 NOTE — ED Notes (Signed)
Incentive teaching done.
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
## 2013-09-24 NOTE — ED Provider Notes (Signed)
Medical screening examination/treatment/procedure(s) were performed by non-physician practitioner and as supervising physician I was immediately available for consultation/collaboration.   EKG Interpretation None        Courtney F Horton, MD 09/24/13 1933 

## 2013-09-28 ENCOUNTER — Emergency Department (HOSPITAL_COMMUNITY)
Admission: EM | Admit: 2013-09-28 | Discharge: 2013-09-28 | Disposition: A | Discharge status: Home / Self Care | Payer: Self-pay | Attending: Emergency Medicine | Admitting: Emergency Medicine

## 2013-09-28 ENCOUNTER — Encounter (HOSPITAL_COMMUNITY): Payer: Self-pay | Admitting: Emergency Medicine

## 2013-09-28 DIAGNOSIS — J4489 Other specified chronic obstructive pulmonary disease: Secondary | ICD-10-CM | POA: Insufficient documentation

## 2013-09-28 DIAGNOSIS — Z79899 Other long term (current) drug therapy: Secondary | ICD-10-CM | POA: Insufficient documentation

## 2013-09-28 DIAGNOSIS — Z4802 Encounter for removal of sutures: Secondary | ICD-10-CM | POA: Insufficient documentation

## 2013-09-28 DIAGNOSIS — Z791 Long term (current) use of non-steroidal anti-inflammatories (NSAID): Secondary | ICD-10-CM | POA: Insufficient documentation

## 2013-09-28 DIAGNOSIS — I10 Essential (primary) hypertension: Secondary | ICD-10-CM | POA: Insufficient documentation

## 2013-09-28 DIAGNOSIS — J449 Chronic obstructive pulmonary disease, unspecified: Secondary | ICD-10-CM | POA: Insufficient documentation

## 2013-09-28 DIAGNOSIS — Z87828 Personal history of other (healed) physical injury and trauma: Secondary | ICD-10-CM | POA: Insufficient documentation

## 2013-09-28 DIAGNOSIS — F17200 Nicotine dependence, unspecified, uncomplicated: Secondary | ICD-10-CM | POA: Insufficient documentation
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
## 2013-09-28 NOTE — ED Notes (Signed)
Patient here for removal of staples from top of head and sutures from earlobe and behind L ear.

## 2013-09-28 NOTE — Discharge Instructions (Signed)
Read the information below.  You may return to the Emergency Department at any time for worsening condition or any new symptoms that concern you.  If you develop redness, swelling, pus draining from the wound, or fevers greater than 100.4, return to the ER immediately for a recheck.     Suture Removal, Care After Refer to this sheet in the next few weeks. These instructions provide you with information on caring for yourself after your procedure. Your health care provider may also give you more specific instructions. Your treatment has been planned according to current medical practices, but problems sometimes occur. Call your health care provider if you have any problems or questions after your procedure. WHAT TO EXPECT AFTER THE PROCEDURE After your stitches (sutures) are removed, it is typical to have the following:  Some discomfort and swelling in the wound area.  Slight redness in the area. HOME CARE INSTRUCTIONS   If you have skin adhesive strips over the wound area, do not take the strips off. They will fall off on their own in a few days. If the strips remain in place after 14 days, you may remove them.  Change any bandages (dressings) at least once a day or as directed by your health care provider. If the bandage sticks, soak it off with warm, soapy water.  Apply cream or ointment only as directed by your health care provider. If using cream or ointment, wash the area with soap and water 2 times a day to remove all the cream or ointment. Rinse off the soap and pat the area dry with a clean towel.  Keep the wound area dry and clean. If the bandage becomes wet or dirty, or if it develops a bad smell, change it as soon as possible.  Continue to protect the wound from injury.  Use sunscreen when out in the sun. New scars become sunburned easily. SEEK MEDICAL CARE IF:  You have increasing redness, swelling, or pain in the wound.  You see pus coming from the wound.  You have a  fever.  You notice a bad smell coming from the wound or dressing.  Your wound breaks open (edges not staying together). Document Released: 12/08/2000 Document Revised: 01/03/2013 Document Reviewed: 10/25/2012 [facility] Patient Information 2015 [employer] [place]. This information is not intended to replace advice given to you by your health care provider. Make sure you discuss any questions you have with your health care provider.  Staple Removal, Care After The staples that were used to close your skin have been removed. The care described here will need to continue until the wound is completely healed and your health care provider confirms that wound care can be stopped. HOME CARE INSTRUCTIONS   Keep the wound site dry and clean. Do not soak it in water.  If skin adhesive strips were applied after the staples were removed, they will begin to peel off in a few days. Allow them to remain in place until they fall off on their own.  If you still have a bandage (dressing), change it at least once a day or as directed by your health care provider. If the dressing sticks, pour warm, sterile water over it until it loosens and can be removed without pulling apart the wound edges. Pat dry with a clean towel.  Apply cream or ointment that stops the growth of bacteria (antibacterial cream or ointment) only if your health care provider has directed you to do so. Place a nonstick bandage over the  wound to prevent the dressing from sticking.  Cover the nonstick bandage with a new dressing as directed by your health care provider.  If the bandage becomes wet, dirty, or develops a bad smell, change it as soon as possible.  New scars become sunburned easily. Use sunscreens with a sun protection factor (SPF) of at least 15 when out in the sun. Reapply the SPF every 2 hours.  Only take medicines as directed by your health care provider. SEEK IMMEDIATE MEDICAL CARE IF:   You have redness, swelling, or  increasing pain in the wound.  You have pus coming from the wound.  You have a fever.  You notice a bad smell coming from the wound or dressing.  Your wound edges open up after staples have been removed. MAKE SURE YOU:   Understand these instructions.  Will watch your condition.  Will get help right away if you are not doing well or get worse. Document Released: 02/26/2008 Document Revised: 03/20/2013 Document Reviewed: 02/26/2008 [facility] Patient Information 2015 [employer] [place]. This information is not intended to replace advice given to you by your health care provider. Make sure you discuss any questions you have with your health care provider.

## 2013-09-30 NOTE — ED Provider Notes (Signed)
Medical screening examination/treatment/procedure(s) were performed by non-physician practitioner and as supervising physician I was immediately available for consultation/collaboration.   EKG Interpretation None       Anjalina Bergevin, MD 09/30/13 1102 

## 2013-11-12 ENCOUNTER — Inpatient Hospital Stay (HOSPITAL_COMMUNITY)
Admission: EM | Admit: 2013-11-12 | Discharge: 2013-11-15 | DRG: 605 | Disposition: A | Discharge status: Home / Self Care | Payer: Self-pay | Attending: General Surgery | Admitting: General Surgery

## 2013-11-12 ENCOUNTER — Encounter (HOSPITAL_COMMUNITY): Payer: Self-pay | Admitting: Emergency Medicine

## 2013-11-12 ENCOUNTER — Emergency Department (HOSPITAL_COMMUNITY): Payer: Self-pay

## 2013-11-12 DIAGNOSIS — J4489 Other specified chronic obstructive pulmonary disease: Secondary | ICD-10-CM | POA: Diagnosis present

## 2013-11-12 DIAGNOSIS — I10 Essential (primary) hypertension: Secondary | ICD-10-CM | POA: Diagnosis present

## 2013-11-12 DIAGNOSIS — S61412A Laceration without foreign body of left hand, initial encounter: Secondary | ICD-10-CM | POA: Diagnosis present

## 2013-11-12 DIAGNOSIS — S61409A Unspecified open wound of unspecified hand, initial encounter: Principal | ICD-10-CM | POA: Diagnosis present

## 2013-11-12 DIAGNOSIS — J939 Pneumothorax, unspecified: Secondary | ICD-10-CM

## 2013-11-12 DIAGNOSIS — F10931 Alcohol use, unspecified with withdrawal delirium: Secondary | ICD-10-CM | POA: Diagnosis present

## 2013-11-12 DIAGNOSIS — Z881 Allergy status to other antibiotic agents status: Secondary | ICD-10-CM

## 2013-11-12 DIAGNOSIS — S2249XA Multiple fractures of ribs, unspecified side, initial encounter for closed fracture: Secondary | ICD-10-CM | POA: Diagnosis present

## 2013-11-12 DIAGNOSIS — J449 Chronic obstructive pulmonary disease, unspecified: Secondary | ICD-10-CM | POA: Diagnosis present

## 2013-11-12 DIAGNOSIS — F1010 Alcohol abuse, uncomplicated: Secondary | ICD-10-CM | POA: Diagnosis present

## 2013-11-12 DIAGNOSIS — Z23 Encounter for immunization: Secondary | ICD-10-CM

## 2013-11-12 DIAGNOSIS — S270XXA Traumatic pneumothorax, initial encounter: Secondary | ICD-10-CM | POA: Diagnosis present

## 2013-11-12 DIAGNOSIS — S2242XA Multiple fractures of ribs, left side, initial encounter for closed fracture: Secondary | ICD-10-CM | POA: Diagnosis present

## 2013-11-12 DIAGNOSIS — F17200 Nicotine dependence, unspecified, uncomplicated: Secondary | ICD-10-CM | POA: Diagnosis present

## 2013-11-12 DIAGNOSIS — F1020 Alcohol dependence, uncomplicated: Secondary | ICD-10-CM | POA: Diagnosis present

## 2013-11-12 DIAGNOSIS — R0789 Other chest pain: Secondary | ICD-10-CM

## 2013-11-12 DIAGNOSIS — R0781 Pleurodynia: Secondary | ICD-10-CM

## 2013-11-12 DIAGNOSIS — F10231 Alcohol dependence with withdrawal delirium: Secondary | ICD-10-CM | POA: Diagnosis present

## 2013-11-12 LAB — I-STAT CHEM 8, ED
BUN: 5 mg/dL — ABNORMAL LOW (ref 6–23)
CALCIUM ION: 1.02 mmol/L — AB (ref 1.12–1.23)
CHLORIDE: 100 meq/L (ref 96–112)
Creatinine, Ser: 1 mg/dL (ref 0.50–1.35)
Glucose, Bld: 118 mg/dL — ABNORMAL HIGH (ref 70–99)
HEMATOCRIT: 46 % (ref 39.0–52.0)
HEMOGLOBIN: 15.6 g/dL (ref 13.0–17.0)
Potassium: 4.1 mEq/L (ref 3.7–5.3)
Sodium: 141 mEq/L (ref 137–147)
TCO2: 21 mmol/L (ref 0–100)

## 2013-11-12 MED ORDER — FENTANYL CITRATE 0.05 MG/ML IJ SOLN
50.0000 ug | Freq: Once | INTRAMUSCULAR | Status: AC
  Administered 2013-11-12: 50 ug via INTRAVENOUS
  Filled 2013-11-12: qty 2

## 2013-11-12 MED ORDER — SODIUM CHLORIDE 0.9 % IV BOLUS (SEPSIS)
500.0000 mL | Freq: Once | INTRAVENOUS | Status: AC
  Administered 2013-11-12: 500 mL via INTRAVENOUS
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
## 2013-11-12 NOTE — ED Notes (Signed)
ETOH on board 

## 2013-11-13 ENCOUNTER — Emergency Department (HOSPITAL_COMMUNITY): Payer: Self-pay

## 2013-11-13 ENCOUNTER — Encounter (HOSPITAL_COMMUNITY): Payer: Self-pay

## 2013-11-13 DIAGNOSIS — S61409A Unspecified open wound of unspecified hand, initial encounter: Secondary | ICD-10-CM

## 2013-11-13 DIAGNOSIS — S2249XA Multiple fractures of ribs, unspecified side, initial encounter for closed fracture: Secondary | ICD-10-CM

## 2013-11-13 DIAGNOSIS — S272XXA Traumatic hemopneumothorax, initial encounter: Secondary | ICD-10-CM

## 2013-11-13 DIAGNOSIS — S270XXA Traumatic pneumothorax, initial encounter: Secondary | ICD-10-CM | POA: Diagnosis present

## 2013-11-13 LAB — CBC WITH DIFFERENTIAL/PLATELET
BASOS ABS: 0 10*3/uL (ref 0.0–0.1)
Basophils Relative: 1 % (ref 0–1)
Eosinophils Absolute: 0 10*3/uL (ref 0.0–0.7)
Eosinophils Relative: 1 % (ref 0–5)
HEMATOCRIT: 42.6 % (ref 39.0–52.0)
Hemoglobin: 14.8 g/dL (ref 13.0–17.0)
Lymphocytes Relative: 12 % (ref 12–46)
Lymphs Abs: 0.7 10*3/uL (ref 0.7–4.0)
MCH: 33.5 pg (ref 26.0–34.0)
MCHC: 34.7 g/dL (ref 30.0–36.0)
MCV: 96.4 fL (ref 78.0–100.0)
Monocytes Absolute: 0.4 10*3/uL (ref 0.1–1.0)
Monocytes Relative: 7 % (ref 3–12)
NEUTROS ABS: 4.8 10*3/uL (ref 1.7–7.7)
Neutrophils Relative %: 79 % — ABNORMAL HIGH (ref 43–77)
PLATELETS: 189 10*3/uL (ref 150–400)
RBC: 4.42 MIL/uL (ref 4.22–5.81)
RDW: 12.7 % (ref 11.5–15.5)
WBC: 6 10*3/uL (ref 4.0–10.5)

## 2013-11-13 LAB — CBC
HEMATOCRIT: 40.7 % (ref 39.0–52.0)
HEMOGLOBIN: 14 g/dL (ref 13.0–17.0)
MCH: 33.4 pg (ref 26.0–34.0)
MCHC: 34.4 g/dL (ref 30.0–36.0)
MCV: 97.1 fL (ref 78.0–100.0)
Platelets: 176 10*3/uL (ref 150–400)
RBC: 4.19 MIL/uL — AB (ref 4.22–5.81)
RDW: 13 % (ref 11.5–15.5)
WBC: 5.5 10*3/uL (ref 4.0–10.5)

## 2013-11-13 LAB — CREATININE, SERUM
Creatinine, Ser: 0.67 mg/dL (ref 0.50–1.35)
GFR calc Af Amer: >90 mL/min (ref >90)

## 2013-11-13 LAB — MRSA PCR SCREENING: MRSA BY PCR: NEGATIVE

## 2013-11-13 MED ORDER — PNEUMOCOCCAL VAC POLYVALENT 25 MCG/0.5ML IJ INJ
0.5000 mL | INJECTION | INTRAMUSCULAR | Status: AC
  Administered 2013-11-14: 0.5 mL via INTRAMUSCULAR
  Filled 2013-11-13: qty 0.5

## 2013-11-13 MED ORDER — MORPHINE SULFATE 4 MG/ML IJ SOLN: 4.0000 mg | Freq: Once | INTRAMUSCULAR | Status: DC

## 2013-11-13 MED ORDER — ALBUTEROL SULFATE HFA 108 (90 BASE) MCG/ACT IN AERS: 2.0000 | INHALATION_SPRAY | Freq: Four times a day (QID) | RESPIRATORY_TRACT | Status: DC | PRN

## 2013-11-13 MED ORDER — OXYCODONE HCL 5 MG PO TABS
5.0000 mg | ORAL_TABLET | ORAL | Status: DC | PRN
  Administered 2013-11-13: 5 mg via ORAL
  Administered 2013-11-14 – 2013-11-15 (×6): 10 mg via ORAL
  Filled 2013-11-13 (×2): qty 2
  Filled 2013-11-13: qty 1
  Filled 2013-11-13 (×4): qty 2

## 2013-11-13 MED ORDER — ONDANSETRON HCL 4 MG/2ML IJ SOLN: 4.0000 mg | Freq: Four times a day (QID) | INTRAMUSCULAR | Status: DC | PRN

## 2013-11-13 MED ORDER — LORAZEPAM 1 MG PO TABS
1.0000 mg | ORAL_TABLET | Freq: Four times a day (QID) | ORAL | Status: DC | PRN
  Administered 2013-11-15: 1 mg via ORAL
  Filled 2013-11-13 (×2): qty 1

## 2013-11-13 MED ORDER — CETYLPYRIDINIUM CHLORIDE 0.05 % MT LIQD
7.0000 mL | Freq: Two times a day (BID) | OROMUCOSAL | Status: DC
  Administered 2013-11-13 – 2013-11-14 (×2): 7 mL via OROMUCOSAL

## 2013-11-13 MED ORDER — LORAZEPAM 1 MG PO TABS
0.0000 mg | ORAL_TABLET | Freq: Four times a day (QID) | ORAL | Status: AC
  Administered 2013-11-13 – 2013-11-14 (×5): 1 mg via ORAL
  Filled 2013-11-13 (×4): qty 1

## 2013-11-13 MED ORDER — HYDROMORPHONE HCL PF 1 MG/ML IJ SOLN
1.0000 mg | INTRAMUSCULAR | Status: DC | PRN
  Administered 2013-11-13 – 2013-11-15 (×9): 1 mg via INTRAVENOUS
  Filled 2013-11-13 (×9): qty 1

## 2013-11-13 MED ORDER — HYDROMORPHONE HCL PF 1 MG/ML IJ SOLN: 0.5000 mg | INTRAMUSCULAR | Status: DC | PRN

## 2013-11-13 MED ORDER — SODIUM CHLORIDE 0.9 % IV SOLN
250.0000 mL | INTRAVENOUS | Status: DC | PRN
Start: 2013-11-13 — End: 2013-11-14

## 2013-11-13 MED ORDER — FENTANYL CITRATE 0.05 MG/ML IJ SOLN
100.0000 ug | Freq: Once | INTRAMUSCULAR | Status: AC
  Administered 2013-11-13: 100 ug via INTRAVENOUS
  Filled 2013-11-13: qty 2

## 2013-11-13 MED ORDER — ADULT MULTIVITAMIN W/MINERALS CH
1.0000 | ORAL_TABLET | Freq: Every day | ORAL | Status: DC
  Administered 2013-11-13 – 2013-11-15 (×3): 1 via ORAL
  Filled 2013-11-13 (×3): qty 1

## 2013-11-13 MED ORDER — SPIRITUS FRUMENTI
2.0000 | Freq: Four times a day (QID) | ORAL | Status: DC
  Administered 2013-11-13 (×2): 1 via ORAL
  Administered 2013-11-14 – 2013-11-15 (×4): 2 via ORAL
  Filled 2013-11-13 (×14): qty 2
  Filled 2013-11-13: qty 1
  Filled 2013-11-13 (×6): qty 2

## 2013-11-13 MED ORDER — HYDROMORPHONE HCL PF 1 MG/ML IJ SOLN
1.0000 mg | Freq: Once | INTRAMUSCULAR | Status: AC
  Administered 2013-11-13: 1 mg via INTRAVENOUS
  Filled 2013-11-13: qty 1

## 2013-11-13 MED ORDER — ONDANSETRON HCL 4 MG PO TABS: 4.0000 mg | ORAL_TABLET | Freq: Four times a day (QID) | ORAL | Status: DC | PRN

## 2013-11-13 MED ORDER — ALBUTEROL SULFATE (2.5 MG/3ML) 0.083% IN NEBU: 2.5000 mg | INHALATION_SOLUTION | Freq: Four times a day (QID) | RESPIRATORY_TRACT | Status: DC | PRN

## 2013-11-13 MED ORDER — LORAZEPAM 1 MG PO TABS: 0.0000 mg | ORAL_TABLET | Freq: Two times a day (BID) | ORAL | Status: DC

## 2013-11-13 MED ORDER — DOCUSATE SODIUM 100 MG PO CAPS
100.0000 mg | ORAL_CAPSULE | Freq: Two times a day (BID) | ORAL | Status: DC
  Administered 2013-11-13 – 2013-11-15 (×4): 100 mg via ORAL
  Filled 2013-11-13 (×5): qty 1

## 2013-11-13 MED ORDER — HYDROCODONE-ACETAMINOPHEN 5-325 MG PO TABS: 1.0000 | ORAL_TABLET | Freq: Four times a day (QID) | ORAL | Status: DC | PRN

## 2013-11-13 MED ORDER — VITAMIN B-1 100 MG PO TABS
100.0000 mg | ORAL_TABLET | Freq: Every day | ORAL | Status: DC
  Administered 2013-11-13 – 2013-11-15 (×3): 100 mg via ORAL
  Filled 2013-11-13 (×3): qty 1

## 2013-11-13 MED ORDER — SODIUM CHLORIDE 0.9 % IJ SOLN
3.0000 mL | INTRAMUSCULAR | Status: DC | PRN
  Administered 2013-11-14: 3 mL via INTRAVENOUS

## 2013-11-13 MED ORDER — LORAZEPAM 2 MG/ML IJ SOLN
1.0000 mg | Freq: Four times a day (QID) | INTRAMUSCULAR | Status: DC | PRN
Start: 2013-11-13 — End: 2013-11-15
  Filled 2013-11-13: qty 1

## 2013-11-13 MED ORDER — ENOXAPARIN SODIUM 40 MG/0.4ML ~~LOC~~ SOLN
40.0000 mg | SUBCUTANEOUS | Status: DC
  Administered 2013-11-13 – 2013-11-14 (×2): 40 mg via SUBCUTANEOUS
  Filled 2013-11-13 (×4): qty 0.4

## 2013-11-13 MED ORDER — ACETAMINOPHEN 325 MG PO TABS
650.0000 mg | ORAL_TABLET | ORAL | Status: DC | PRN
Start: 2013-11-13 — End: 2013-11-15
  Administered 2013-11-13: 650 mg via ORAL
  Filled 2013-11-13 (×2): qty 2

## 2013-11-13 MED ORDER — SODIUM CHLORIDE 0.9 % IJ SOLN
3.0000 mL | Freq: Two times a day (BID) | INTRAMUSCULAR | Status: DC
  Administered 2013-11-13: 17:00:00 via INTRAVENOUS
  Administered 2013-11-13 – 2013-11-14 (×2): 3 mL via INTRAVENOUS

## 2013-11-13 MED ORDER — FOLIC ACID 1 MG PO TABS
1.0000 mg | ORAL_TABLET | Freq: Every day | ORAL | Status: DC
  Administered 2013-11-13 – 2013-11-15 (×3): 1 mg via ORAL
  Filled 2013-11-13 (×3): qty 1

## 2013-11-13 MED ORDER — THIAMINE HCL 100 MG/ML IJ SOLN
100.0000 mg | Freq: Every day | INTRAMUSCULAR | Status: DC
  Filled 2013-11-13: qty 1

## 2013-11-13 NOTE — ED Notes (Signed)
Admit MD at bedside
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
## 2013-11-13 NOTE — ED Notes (Signed)
Called flow manager to check on delay in pt getting a bed

## 2013-11-13 NOTE — ED Notes (Addendum)
Pt. Watching TV. States pain is doing better. O2 sats noted to be dropping 84-90% on RA. Placed on O2 at 2L per [place]. O2 Sats 95%

## 2013-11-13 NOTE — Progress Notes (Signed)
UR completed Ferdinand CavaAndrea Schettino, RN, BSN, Case Manager, [phone] 11/13/2013 12:32 PM

## 2013-11-13 NOTE — ED Notes (Signed)
Pt. ambulated to BR on pulse ox. O2 sat 93-95 on RA.

## 2013-11-13 NOTE — ED Notes (Signed)
Patient transported to X-ray 
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
## 2013-11-13 NOTE — ED Notes (Signed)
Pt. Given 2 cokes.
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
## 2013-11-13 NOTE — ED Notes (Signed)
Patient has now been changed to stepdown bed.
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
## 2013-11-13 NOTE — Discharge Instructions (Signed)
Rib Contusion A rib contusion (bruise) can occur by a blow to the chest or by a fall against a hard object. Usually these will be much better in a couple weeks. If X-rays were taken today and there are no broken bones (fractures), the diagnosis of bruising is made. However, broken ribs may not show up for several days, or may be discovered later on a routine X-ray when signs of healing show up. If this happens to you, it does not mean that something was missed on the X-ray, but simply that it did not show up on the first X-rays. Earlier diagnosis will not usually change the treatment. HOME CARE INSTRUCTIONS   Avoid strenuous activity. Be careful during activities and avoid bumping the injured ribs. Activities that pull on the injured ribs and cause pain should be avoided, if possible.  For the first day or two, an ice pack used every 20 minutes while awake may be helpful. Put ice in a plastic bag and put a towel between the bag and the skin.  Eat a normal, well-balanced diet. Drink plenty of fluids to avoid constipation.  Take deep breaths several times a day to keep lungs free of infection. Try to cough several times a day. Splint the injured area with a pillow while coughing to ease pain. Coughing can help prevent pneumonia.  Wear a rib belt or binder only if told to do so by your caregiver. If you are wearing a rib belt or binder, you must do the breathing exercises as directed by your caregiver. If not used properly, rib belts or binders restrict breathing which can lead to pneumonia.  Only take over-the-counter or prescription medicines for pain, discomfort, or fever as directed by your caregiver. SEEK MEDICAL CARE IF:   You or your child has an oral temperature above 102 F (38.9 C).  Your baby is older than 3 months with a rectal temperature of 100.5 F (38.1 C) or higher for more than 1 day.  You develop a cough, with thick or bloody sputum. SEEK IMMEDIATE MEDICAL CARE IF:   You  have difficulty breathing.  You feel sick to your stomach (nausea), have vomiting or belly (abdominal) pain.  You have worsening pain, not controlled with medications, or there is a change in the location of the pain.  You develop sweating or radiation of the pain into the arms, jaw or shoulders, or become light headed or faint.  You or your child has an oral temperature above 102 F (38.9 C), not controlled by medicine.  Your or your baby is older than 3 months with a rectal temperature of 102 F (38.9 C) or higher.  Your baby is 9 months old or younger with a rectal temperature of 100.4 F (38 C) or higher. MAKE SURE YOU:   Understand these instructions.  Will watch your condition.  Will get help right away if you are not doing well or get worse. Document Released: 12/08/2000 Document Revised: 07/10/2012 Document Reviewed: 11/01/2007 [facility] Patient Information 2015 [place], [place]. This information is not intended to replace advice given to you by your health care provider. Make sure you discuss any questions you have with your health care provider.  Laceration Care, Adult A laceration is a cut or lesion that goes through all layers of the skin and into the tissue just beneath the skin. TREATMENT  Some lacerations may not require closure. Some lacerations may not be able to be closed due to an increased risk of infection.  It is important to see your caregiver as soon as possible after an injury to minimize the risk of infection and maximize the opportunity for successful closure. If closure is appropriate, pain medicines may be given, if needed. The wound will be cleaned to help prevent infection. Your caregiver will use stitches (sutures), staples, wound glue (adhesive), or skin adhesive strips to repair the laceration. These tools bring the skin edges together to allow for faster healing and a better cosmetic outcome. However, all wounds will heal with a scar. Once the wound has  healed, scarring can be minimized by covering the wound with sunscreen during the day for 1 full year. HOME CARE INSTRUCTIONS  For sutures or staples:  Keep the wound clean and dry.  If you were given a bandage (dressing), you should change it at least once a day. Also, change the dressing if it becomes wet or dirty, or as directed by your caregiver.  Wash the wound with soap and water 2 times a day. Rinse the wound off with water to remove all soap. Pat the wound dry with a clean towel.  After cleaning, apply a thin layer of the antibiotic ointment as recommended by your caregiver. This will help prevent infection and keep the dressing from sticking.  You may shower as usual after the first 24 hours. Do not soak the wound in water until the sutures are removed.  Only take over-the-counter or prescription medicines for pain, discomfort, or fever as directed by your caregiver.  Get your sutures or staples removed as directed by your caregiver. For skin adhesive strips:  Keep the wound clean and dry.  Do not get the skin adhesive strips wet. You may bathe carefully, using caution to keep the wound dry.  If the wound gets wet, pat it dry with a clean towel.  Skin adhesive strips will fall off on their own. You may trim the strips as the wound heals. Do not remove skin adhesive strips that are still stuck to the wound. They will fall off in time. For wound adhesive:  You may briefly wet your wound in the shower or bath. Do not soak or scrub the wound. Do not swim. Avoid periods of heavy perspiration until the skin adhesive has fallen off on its own. After showering or bathing, gently pat the wound dry with a clean towel.  Do not apply liquid medicine, cream medicine, or ointment medicine to your wound while the skin adhesive is in place. This may loosen the film before your wound is healed.  If a dressing is placed over the wound, be careful not to apply tape directly over the skin  adhesive. This may cause the adhesive to be pulled off before the wound is healed.  Avoid prolonged exposure to sunlight or tanning lamps while the skin adhesive is in place. Exposure to ultraviolet light in the first year will darken the scar.  The skin adhesive will usually remain in place for 5 to 10 days, then naturally fall off the skin. Do not pick at the adhesive film. You may need a tetanus shot if:  You cannot remember when you had your last tetanus shot.  You have never had a tetanus shot. If you get a tetanus shot, your arm may swell, get red, and feel warm to the touch. This is common and not a problem. If you need a tetanus shot and you choose not to have one, there is a rare chance of getting tetanus. Sickness from  tetanus can be serious. SEEK MEDICAL CARE IF:   You have redness, swelling, or increasing pain in the wound.  You see a red line that goes away from the wound.  You have yellowish-white fluid (pus) coming from the wound.  You have a fever.  You notice a bad smell coming from the wound or dressing.  Your wound breaks open before or after sutures have been removed.  You notice something coming out of the wound such as wood or glass.  Your wound is on your hand or foot and you cannot move a finger or toe. SEEK IMMEDIATE MEDICAL CARE IF:   Your pain is not controlled with prescribed medicine.  You have severe swelling around the wound causing pain and numbness or a change in color in your arm, hand, leg, or foot.  Your wound splits open and starts bleeding.  You have worsening numbness, weakness, or loss of function of any joint around or beyond the wound.  You develop painful lumps near the wound or on the skin anywhere on your body. MAKE SURE YOU:   Understand these instructions.  Will watch your condition.  Will get help right away if you are not doing well or get worse. Document Released: 03/15/2005 Document Revised: 06/07/2011 Document Reviewed:  09/08/2010 [facility] Patient Information 2015 [place], [place]. This information is not intended to replace advice given to you by your health care provider. Make sure you discuss any questions you have with your health care provider.   Assault, General Assault includes any behavior, whether intentional or reckless, which results in bodily injury to another person and/or damage to property. Included in this would be any behavior, intentional or reckless, that by its nature would be understood (interpreted) by a reasonable person as intent to harm another person or to damage his/her property. Threats may be oral or written. They may be communicated through regular mail, computer, fax, or phone. These threats may be direct or implied. FORMS OF ASSAULT INCLUDE:  Physically assaulting a person. This includes physical threats to inflict physical harm as well as:  Slapping.  Hitting.  Poking.  Kicking.  Punching.  Pushing.  Arson.  Sabotage.  Equipment vandalism.  Damaging or destroying property.  Throwing or hitting objects.  Displaying a weapon or an object that appears to be a weapon in a threatening manner.  Carrying a firearm of any kind.  Using a weapon to harm someone.  Using greater physical size/strength to intimidate another.  Making intimidating or threatening gestures.  Bullying.  Hazing.  Intimidating, threatening, hostile, or abusive language directed toward another person.  It communicates the intention to engage in violence against that person. And it leads a reasonable person to expect that violent behavior may occur.  Stalking another person. IF IT HAPPENS AGAIN:  Immediately call for emergency help (911 in [place]  If someone poses clear and immediate danger to you, seek legal authorities to have a protective or restraining order put in place.  Less threatening assaults can at least be reported to authorities. STEPS TO TAKE IF A SEXUAL ASSAULT HAS  HAPPENED  Go to an area of safety. This may include a shelter or staying with a friend. Stay away from the area where you have been attacked. A large percentage of sexual assaults are caused by a friend, relative or associate.  If medications were given by your caregiver, take them as directed for the full length of time prescribed.  Only take over-the-counter or prescription medicines for  pain, discomfort, or fever as directed by your caregiver.  If you have come in contact with a sexual disease, find out if you are to be tested again. If your caregiver is concerned about the HIV/AIDS virus, he/she may require you to have continued testing for several months.  For the protection of your privacy, test results can not be given over the phone. Make sure you receive the results of your test. If your test results are not back during your visit, make an appointment with your caregiver to find out the results. Do not assume everything is normal if you have not heard from your caregiver or the medical facility. It is important for you to follow up on all of your test results.  File appropriate papers with authorities. This is important in all assaults, even if it has occurred in a family or by a friend. SEEK MEDICAL CARE IF:  You have new problems because of your injuries.  You have problems that may be because of the medicine you are taking, such as:  Rash.  Itching.  Swelling.  Trouble breathing.  You develop belly (abdominal) pain, feel sick to your stomach (nausea) or are vomiting.  You begin to run a temperature.  You need supportive care or referral to a rape crisis center. These are centers with trained personnel who can help you get through this ordeal. SEEK IMMEDIATE MEDICAL CARE IF:  You are afraid of being threatened, beaten, or abused. In [place]., call 911.  You receive new injuries related to abuse.  You develop severe pain in any area injured in the assault or have any  change in your condition that concerns you.  You faint or lose consciousness.  You develop chest pain or shortness of breath. Document Released: 03/15/2005 Document Revised: 06/07/2011 Document Reviewed: 11/01/2007 [facility] Patient Information 2015 [place]. This information is not intended to replace advice given to you by your health care provider. Make sure you discuss any questions you have with your health care provider.   Emergency Department Resource Guide 1) Find a Doctor and Pay Out of Pocket Although you won't have to find out who is covered by your insurance plan, it is a good idea to ask around and get recommendations. You will then need to call the office and see if the doctor you have chosen will accept you as a new patient and what types of options they offer for patients who are self-pay. Some doctors offer discounts or will set up payment plans for their patients who do not have insurance, but you will need to ask so you aren't surprised when you get to your appointment.  2) Contact Your Local Health Department Not all health departments have doctors that can see patients for sick visits, but many do, so it is worth a call to see if yours does. If you don't know where your local health department is, you can check in your phone book. The CDC also has a tool to help you locate your state's health department, and many state websites also have listings of all of their local health departments.  3) Find a Walk-in Clinic If your illness is not likely to be very severe or complicated, you may want to try a walk in clinic. These are popping up all over the country in pharmacies, drugstores, and shopping centers. They're usually staffed by nurse practitioners or physician assistants that have been trained to treat common illnesses and complaints. They're usually fairly quick and inexpensive.  However, if you have serious medical issues or chronic medical problems, these are probably  not your best option.  No Primary Care Doctor: - Call Health Connect at  [phone] - they can help you locate a primary care doctor that  accepts your insurance, provides certain services, etc. - Physician Referral Service- [phone]  Chronic Pain Problems: Organization         Address  Phone   Notes  [facility]  [phone] Patients need to be referred by their primary care doctor.   Medication Assistance: Organization         Address  Phone   Notes  [facility] [address] [place] [place], [place] [address] [phone] --Must be a resident of [facility] -- Must have NO insurance coverage whatsoever (no Medicaid/ Medicare, etc.) -- The pt. MUST have a primary care doctor that directs their care regularly and follows them in the community   [facility]  [phone]   [employer]  [phone]    Agencies that provide inexpensive medical care: Organization         Address  Phone   Notes  [facility]  [phone]   [facility]    [phone]   [facility] [address] [place], [place] [address] [phone]   [facility] [address], [place] [phone]   Planned Parenthood    [phone]   [facility]    [phone]   [facility] and [facility]  [address], [place] Phone:  [phone], Fax:  [phone] Hours of Operation:  9 am - 6 pm, M-F.  Also accepts Medicaid/Medicare and self-pay.  [facility]  [address], [place] Phone: [phone], Fax: [phone]. Hours of Operation:  8:30 am - 5:30 pm, M-F.  Also accepts Medicaid and self-pay.  [facility] [place] [facility] [place] [address] [place] Phone: [phone]   [facility] [address] Natasha Bence [place], [place] [phone], Ext. [phone] Mondays & Thursdays: 7-9 AM.   First 15 patients are seen on a first come, first serve basis.    Medicaid-accepting [facility] Providers:  Organization         Address  Phone   Notes  [facility] [address], [place] [phone] Also accepts self-pay patients.  [facility] [address] Laurell Josephs [place], [place]  [phone]   [facility] [address], [place] [phone]   [facility] [address], [place] [phone]   Renaye Rakers [address], [place]   [phone] Only accepts [place] Access [place] patients after they have their name applied to their card.   Self-Pay (no insurance) in [facility]:  Organization         Address  Phone   Notes  Sickle Cell Patients, [facility] [address] [place], [place] [phone]   [facility] [address] [place], [place] [phone]   [facility] [place]  [address],  [phone]   [facility]/Dr. Osei-Bonsu  [address], [place] or [address], [place] [phone] Phone number for both [place] and [place] locations is the same.  Urgent Medical and [facility] [address] [place] [phone]   [facility] [address] [place] or [address] [phone]   [facility] [address], [place] [phone], phone; [phone], fax Sees patients 1st and 3rd Saturday of every month.  Must not qualify for public or private insurance (i.e. Medicaid, Medicare, [place] Health Choice, Veterans' Benefits)  Household income should be no more than 200% of the poverty level The clinic cannot treat you if you are pregnant or think you are pregnant  Sexually transmitted diseases are not treated at the clinic.    Dental  Care: Organization         Address  Phone  Notes  [facility] Department of [facility] [address] [place], [place] [phone] Accepts children up to age 66 who are enrolled in [place] or [place] Health Choice; pregnant women with a Medicaid card; and children who have applied for Medicaid or [place] Health Choice, but were declined, whose parents can pay a reduced fee at time of service.  [facility]  [address], [place] [phone] Accepts children up to age 39 who are enrolled in [place] or [place] Health Choice; pregnant women with a Medicaid card; and children who have applied for Medicaid or [place] Health Choice, but were declined, whose parents can pay a reduced fee at time of service.  [facility]  [address] [place], [place] [phone] Patients are seen by appointment only. Walk-ins are not accepted. [facility] will see patients 27 years of age and older. Monday - Tuesday (8am-5pm) Most Wednesdays (8:30-5pm) $30 per visit, cash only  [facility]  [address], [facility] [phone] Patients are seen by appointment only. Walk-ins are not accepted. [facility] will see patients 65 years of age and older. One Wednesday Evening (Monthly: Volunteer Based).  $30 per visit, cash only  [employer]  [phone] for adults; Children under age 66, call Graduate Pediatric Dentistry at [phone]. Children aged 6-14, please call [phone] to request a pediatric application.  Dental services are provided in all areas of dental care including fillings, crowns and bridges, complete and partial dentures, implants, gum treatment, root canals, and extractions. Preventive care is also provided. Treatment is provided to both adults and children. Patients are selected via a lottery and there is often a waiting list.   [facility] [address] [place]  [phone] [email or web address]   [facility] [address] [place], [place] [phone], Ext. [phone] Second and Fourth Thursday of each month, opens at 6:30 AM; Clinic ends at 9 AM.  Patients are seen on a first-come first-served basis, and a limited number are seen during each clinic.   [facility]  [address] Ether Griffins [place], [place] [phone]   Eligibility Requirements You must have lived in [place], [place], or [place] counties for at least the last three months.   You cannot be eligible for state or federal sponsored [place], including [employer], [place], or [employer].   You generally cannot be eligible for healthcare insurance through your employer.    How to apply: Eligibility screenings are held every Tuesday and Wednesday afternoon from 1:00 pm until 4:00 pm. You  do not need an appointment for the interview!  [facility] [address] [place], [place] [phone]   [facility] Health Department  [phone]   [address] [facility] Health Department  [phone]   [facility] Health Department  [phone]    Behavioral Health Resources in the Community: Intensive Outpatient Programs Organization         Address  Phone  Notes  [facility] [address] [place], [place] [phone]   [facility] [address] [place], [place] [phone]   [facility] [address], [place], [place]  [phone]   [facility] Health [address]  [place], [place] [phone] or [phone]   Substance Abuse Resources Organization         Address  Phone  Notes  [facility]  [phone]   [facility]  [phone]   [facility] [place]  [phone]   Floydene Flock  [phone]   Residential & Outpatient Substance Abuse Program  [phone]     Psychological Services Organization         Address  Phone  Notes  [facility]  [phone]   [facility] Services  [phone]   [facility] [address], [place] [phone] or [phone]    Mobile Crisis Teams Organization         Address  Phone  Notes  [facility], Mobile Crisis Care Unit  [phone]   [facility]  [address] [place], [place] [phone]   Doristine Locks [address] [place] [phone]    Self-Help/Support Groups Organization         Address  Phone             Notes  [facility] [place] - variety of support groups  [phone]- [phone] Call for more information  Narcotics Anonymous (NA), [facility] [address], [employer] [place]  2 meetings at this location   Statistician         Address  Phone  Notes  [facility] [address],    [place]  [phone]   [facility]  [address] [place] [address], [place], [place] [phone]   [facility] [address] [place], [place] [phone] Admissions: 8am-3pm M-F  [facility] [address]    [place], [place] [phone]   [facility] [address] [place], [place], [place] [phone]   [facility] [address]  [place], [place] [phone]   [facility] - Intensive Outpatient [address] Laurell Josephs [address], [place], [place] [phone]   [facility] ([facility] [address] [place].,  [place], [place] [phone] or [phone]   [facility] ([facility]) [address], [place], [place] [phone] Accepts Medicaid  [facility] [address]  [place] [phone] Substance Abuse/Addiction Treatment   [facility]         Address  Phone  Notes  [facility]  [phone]   Angie Fava, PhD [address] [place]vin Knack [place], [place]   [phone] or [phone]   [facility]   [address] [place], [place] [phone]   [facility] [address], [place], [place] [phone] Insurance/Medicaid/sponsorship through [employer] and Families [address]  [place], [place] [phone] [place], [place] [phone]    Dr. Adele Schilder  [phone]   [facility] Dept. 1) [address], [place] 2) [place] 3)  [address], [place] [phone]  [phone]   [place] [phone] or [phone] (After Hours)

## 2013-11-13 NOTE — ED Provider Notes (Signed)
Medical screening examination/treatment/procedure(s) were conducted as a shared visit with non-physician practitioner(s) and myself.  I personally evaluated the patient during the encounter.   EKG Interpretation None        Candyce ChurnJohn David Laurabeth Yip III, MD 11/13/13 [phone]

## 2013-11-13 NOTE — Progress Notes (Addendum)
D/t pt h/o drinking 14 drinks/day since the 1970s, order received to allow pt to have alcohol while in hospital.

## 2013-11-13 NOTE — ED Notes (Signed)
Spoke with Admit MD from Trauma services. States that she is aware of him and will come down and admit him.

## 2013-11-13 NOTE — ED Notes (Addendum)
Spoke with Admit MD. Orders received. Pt. Can have clear liquids.

## 2013-11-13 NOTE — ED Notes (Signed)
DR Lindie SpruceWYatt has been in to see pt

## 2013-11-13 NOTE — ED Notes (Signed)
Spoke to [place] with trauma. Advise him that pt states he is an alcoholic and his last drink was around 430 yesterday. Currently he has visible tremors, sweating and anxiety. Casimiro NeedleMichael is going to put in detox orders for pt

## 2013-11-14 ENCOUNTER — Inpatient Hospital Stay (HOSPITAL_COMMUNITY): Payer: Self-pay

## 2013-11-14 MED ORDER — ENSURE COMPLETE PO LIQD: 237.0000 mL | Freq: Two times a day (BID) | ORAL | Status: DC

## 2013-11-14 NOTE — Progress Notes (Signed)
Assessing for alcohol withdrawal s/s often. Patient consistently has visible tremors. CIWA score q6h. See assessment. Told patient that he had 2 beers ordered, I got one from the fridge. Patient was not interested, did not open. Later, reminded him that I had cold beer for him and he was interested in one, but after getting it for him he only took 1 sip. Ativan prn ordered as well. Will continue to monitor closely for s/s withdrawal.
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
## 2013-11-14 NOTE — Plan of Care (Signed)
Problem: Phase I Progression Outcomes Goal: Initial discharge plan identified Outcome: Completed/Met Date Met:  11/14/13 Anticipate discharge to pt's mother's home.
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
## 2013-11-15 DIAGNOSIS — F1010 Alcohol abuse, uncomplicated: Secondary | ICD-10-CM | POA: Diagnosis present

## 2013-11-15 DIAGNOSIS — I10 Essential (primary) hypertension: Secondary | ICD-10-CM | POA: Insufficient documentation

## 2013-11-15 DIAGNOSIS — S2242XA Multiple fractures of ribs, left side, initial encounter for closed fracture: Secondary | ICD-10-CM | POA: Diagnosis present

## 2013-11-15 DIAGNOSIS — J449 Chronic obstructive pulmonary disease, unspecified: Secondary | ICD-10-CM | POA: Insufficient documentation

## 2013-11-15 DIAGNOSIS — S61412A Laceration without foreign body of left hand, initial encounter: Secondary | ICD-10-CM | POA: Diagnosis present

## 2013-11-15 MED ORDER — OXYCODONE-ACETAMINOPHEN 5-325 MG PO TABS: 1.0000 | ORAL_TABLET | ORAL | Status: DC | PRN
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
## 2013-11-15 NOTE — Progress Notes (Signed)
D/C Lajune Perine, MD, MPH, FACS Trauma: [phone] General Surgery: [phone]  

## 2013-11-15 NOTE — Discharge Summary (Signed)
Joel Turek, MD, MPH, FACS Trauma: [phone] General Surgery: [phone]  
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
## 2013-11-21 ENCOUNTER — Ambulatory Visit (INDEPENDENT_AMBULATORY_CARE_PROVIDER_SITE_OTHER): Payer: Self-pay | Admitting: General Surgery

## 2013-11-21 ENCOUNTER — Encounter (INDEPENDENT_AMBULATORY_CARE_PROVIDER_SITE_OTHER): Payer: Self-pay | Admitting: General Surgery

## 2013-11-21 DIAGNOSIS — Z5189 Encounter for other specified aftercare: Secondary | ICD-10-CM

## 2013-11-21 DIAGNOSIS — S2242XD Multiple fractures of ribs, left side, subsequent encounter for fracture with routine healing: Secondary | ICD-10-CM

## 2013-11-21 DIAGNOSIS — IMO0001 Reserved for inherently not codable concepts without codable children: Secondary | ICD-10-CM

## 2013-11-21 DIAGNOSIS — S61409A Unspecified open wound of unspecified hand, initial encounter: Secondary | ICD-10-CM

## 2013-11-21 DIAGNOSIS — Z4802 Encounter for removal of sutures: Secondary | ICD-10-CM

## 2013-11-21 DIAGNOSIS — S61412D Laceration without foreign body of left hand, subsequent encounter: Secondary | ICD-10-CM

## 2013-11-21 MED ORDER — METHOCARBAMOL 750 MG PO TABS: 750.0000 mg | ORAL_TABLET | Freq: Three times a day (TID) | ORAL | Status: DC | PRN

## 2013-11-21 MED ORDER — CEPHALEXIN 500 MG PO CAPS: 500.0000 mg | ORAL_CAPSULE | Freq: Four times a day (QID) | ORAL | Status: AC

## 2013-11-21 MED ORDER — IBUPROFEN 200 MG PO TABS: 800.0000 mg | ORAL_TABLET | Freq: Three times a day (TID) | ORAL | Status: DC | PRN

## 2013-11-21 NOTE — Patient Instructions (Addendum)
Plan: 1.  Start Ibuprofen  up to 3 times a day for pain/inflammation 2.  Start Robaxin  up to 3 times a day for pain/muscle relaxer 3.  Take oxycodone's sparingly take them 1-2 tabs every 6 hours for pain or less if able 4.  Use Ice/heat as needed 5.  Start range of motion exercises/stretches/exercising to help prevent stiffness 6.  Encouraged to walk 7.  Start Keflex for 7 days for your hand cellulitis (take 4 times a day until its gone) 8.  Wash left hand with soap and water, replace with triple antibiotic ointment and band aid
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
## 2013-11-23 ENCOUNTER — Inpatient Hospital Stay: Payer: Self-pay

## 2013-11-26 ENCOUNTER — Inpatient Hospital Stay: Payer: Self-pay

## 2013-11-28 ENCOUNTER — Ambulatory Visit: Payer: Self-pay | Attending: Internal Medicine | Admitting: Internal Medicine

## 2013-11-28 VITALS — BP 100/60 | HR 86 | Temp 98.9°F | Resp 12 | Ht 67.0 in | Wt 161.0 lb

## 2013-11-28 DIAGNOSIS — F411 Generalized anxiety disorder: Secondary | ICD-10-CM | POA: Insufficient documentation

## 2013-11-28 DIAGNOSIS — K700 Alcoholic fatty liver: Secondary | ICD-10-CM

## 2013-11-28 DIAGNOSIS — S2249XA Multiple fractures of ribs, unspecified side, initial encounter for closed fracture: Secondary | ICD-10-CM | POA: Insufficient documentation

## 2013-11-28 DIAGNOSIS — S61409A Unspecified open wound of unspecified hand, initial encounter: Secondary | ICD-10-CM | POA: Insufficient documentation

## 2013-11-28 DIAGNOSIS — J449 Chronic obstructive pulmonary disease, unspecified: Secondary | ICD-10-CM | POA: Insufficient documentation

## 2013-11-28 DIAGNOSIS — F3289 Other specified depressive episodes: Secondary | ICD-10-CM | POA: Insufficient documentation

## 2013-11-28 DIAGNOSIS — Z720 Tobacco use: Secondary | ICD-10-CM

## 2013-11-28 DIAGNOSIS — F32A Depression, unspecified: Secondary | ICD-10-CM

## 2013-11-28 DIAGNOSIS — S2242XD Multiple fractures of ribs, left side, subsequent encounter for fracture with routine healing: Secondary | ICD-10-CM

## 2013-11-28 DIAGNOSIS — S270XXA Traumatic pneumothorax, initial encounter: Secondary | ICD-10-CM | POA: Insufficient documentation

## 2013-11-28 DIAGNOSIS — J42 Unspecified chronic bronchitis: Secondary | ICD-10-CM

## 2013-11-28 DIAGNOSIS — Z23 Encounter for immunization: Secondary | ICD-10-CM

## 2013-11-28 DIAGNOSIS — Z0000 Encounter for general adult medical examination without abnormal findings: Secondary | ICD-10-CM

## 2013-11-28 DIAGNOSIS — F329 Major depressive disorder, single episode, unspecified: Secondary | ICD-10-CM | POA: Insufficient documentation

## 2013-11-28 DIAGNOSIS — IMO0001 Reserved for inherently not codable concepts without codable children: Secondary | ICD-10-CM

## 2013-11-28 DIAGNOSIS — K7689 Other specified diseases of liver: Secondary | ICD-10-CM | POA: Insufficient documentation

## 2013-11-28 DIAGNOSIS — J4489 Other specified chronic obstructive pulmonary disease: Secondary | ICD-10-CM | POA: Insufficient documentation

## 2013-11-28 DIAGNOSIS — F1010 Alcohol abuse, uncomplicated: Secondary | ICD-10-CM | POA: Insufficient documentation

## 2013-11-28 DIAGNOSIS — F17200 Nicotine dependence, unspecified, uncomplicated: Secondary | ICD-10-CM | POA: Insufficient documentation

## 2013-11-28 MED ORDER — TIOTROPIUM BROMIDE MONOHYDRATE 18 MCG IN CAPS: 18.0000 ug | ORAL_CAPSULE | Freq: Every day | RESPIRATORY_TRACT | Status: DC

## 2013-11-28 MED ORDER — OXYCODONE HCL 5 MG PO TABS: 5.0000 mg | ORAL_TABLET | Freq: Four times a day (QID) | ORAL | Status: DC | PRN

## 2013-11-28 MED ORDER — METHOCARBAMOL 750 MG PO TABS: 750.0000 mg | ORAL_TABLET | Freq: Three times a day (TID) | ORAL | Status: DC | PRN
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
## 2013-11-28 NOTE — Progress Notes (Signed)
LCSW met with patient who openly identfiied that he was addicted to alcohol but did not desire treatment at this time.  LCSW asked patient what he did need at this time and he stated that he needed help with his healthcare.  LCSW recommended that patient work with financial counseling to apply for the [employer] and connect with [facility] for identification support and other resources.  LCSW requested that the nurse get patient an application for [facility] at check out and LCSW provided patient with contact information to the [facility].   Patient was encouraged to follow up with LCSW as needed.  Christene Lye MSW, LCSW

## 2013-12-24 ENCOUNTER — Other Ambulatory Visit: Payer: Self-pay

## 2013-12-28 ENCOUNTER — Ambulatory Visit: Payer: Self-pay | Admitting: Internal Medicine

## 2014-03-13 ENCOUNTER — Emergency Department (HOSPITAL_COMMUNITY): Payer: Self-pay

## 2014-03-13 ENCOUNTER — Encounter (HOSPITAL_COMMUNITY): Payer: Self-pay | Admitting: Emergency Medicine

## 2014-03-13 ENCOUNTER — Emergency Department (HOSPITAL_COMMUNITY)
Admission: EM | Admit: 2014-03-13 | Discharge: 2014-03-13 | Disposition: A | Discharge status: Home / Self Care | Payer: Self-pay | Attending: Emergency Medicine | Admitting: Emergency Medicine

## 2014-03-13 DIAGNOSIS — Z8659 Personal history of other mental and behavioral disorders: Secondary | ICD-10-CM | POA: Insufficient documentation

## 2014-03-13 DIAGNOSIS — J45909 Unspecified asthma, uncomplicated: Secondary | ICD-10-CM | POA: Insufficient documentation

## 2014-03-13 DIAGNOSIS — Z79899 Other long term (current) drug therapy: Secondary | ICD-10-CM | POA: Insufficient documentation

## 2014-03-13 DIAGNOSIS — J069 Acute upper respiratory infection, unspecified: Secondary | ICD-10-CM | POA: Insufficient documentation

## 2014-03-13 DIAGNOSIS — F10129 Alcohol abuse with intoxication, unspecified: Secondary | ICD-10-CM | POA: Insufficient documentation

## 2014-03-13 DIAGNOSIS — F1010 Alcohol abuse, uncomplicated: Secondary | ICD-10-CM

## 2014-03-13 DIAGNOSIS — R110 Nausea: Secondary | ICD-10-CM | POA: Insufficient documentation

## 2014-03-13 DIAGNOSIS — Z8781 Personal history of (healed) traumatic fracture: Secondary | ICD-10-CM | POA: Insufficient documentation

## 2014-03-13 DIAGNOSIS — I10 Essential (primary) hypertension: Secondary | ICD-10-CM | POA: Insufficient documentation

## 2014-03-13 DIAGNOSIS — Z720 Tobacco use: Secondary | ICD-10-CM | POA: Insufficient documentation

## 2014-03-13 LAB — BASIC METABOLIC PANEL
ANION GAP: 18 — AB (ref 5–15)
BUN: 3 mg/dL — ABNORMAL LOW (ref 6–23)
CO2: 21 mEq/L (ref 19–32)
Calcium: 8.7 mg/dL (ref 8.4–10.5)
Chloride: 98 mEq/L (ref 96–112)
Creatinine, Ser: 0.57 mg/dL (ref 0.50–1.35)
GLUCOSE: 124 mg/dL — AB (ref 70–99)
POTASSIUM: 3.8 meq/L (ref 3.7–5.3)
SODIUM: 137 meq/L (ref 137–147)

## 2014-03-13 LAB — CBC
HCT: 43.6 % (ref 39.0–52.0)
Hemoglobin: 15.3 g/dL (ref 13.0–17.0)
MCH: 33.6 pg (ref 26.0–34.0)
MCHC: 35.1 g/dL (ref 30.0–36.0)
MCV: 95.6 fL (ref 78.0–100.0)
PLATELETS: 238 10*3/uL (ref 150–400)
RBC: 4.56 MIL/uL (ref 4.22–5.81)
RDW: 12.4 % (ref 11.5–15.5)
WBC: 7 10*3/uL (ref 4.0–10.5)

## 2014-03-13 LAB — I-STAT TROPONIN, ED: TROPONIN I, POC: 0.01 ng/mL (ref 0.00–0.08)

## 2014-03-13 MED ORDER — BENZONATATE 100 MG PO CAPS: 100.0000 mg | ORAL_CAPSULE | Freq: Three times a day (TID) | ORAL | Status: DC

## 2014-03-13 MED ORDER — SODIUM CHLORIDE 0.9 % IV BOLUS (SEPSIS)
1000.0000 mL | Freq: Once | INTRAVENOUS | Status: AC
  Administered 2014-03-13: 1000 mL via INTRAVENOUS

## 2014-03-13 MED ORDER — KETOROLAC TROMETHAMINE 30 MG/ML IJ SOLN
30.0000 mg | Freq: Once | INTRAMUSCULAR | Status: AC
  Administered 2014-03-13: 30 mg via INTRAVENOUS
  Filled 2014-03-13: qty 1

## 2014-03-13 NOTE — ED Notes (Signed)
Pt was able to walk well done the hall. Pt maintained a good pace and had a steady gait. Pt held onto fluid pole while walking.

## 2014-03-13 NOTE — ED Provider Notes (Signed)
CSN: [id number]     Arrival date & time 03/13/14  1936 History   First MD Initiated Contact with Patient 03/13/14 1953     Chief Complaint  Patient presents with  . Cough  . Fatigue     (Consider location/radiation/quality/duration/timing/severity/associated sxs/prior Treatment) HPI Comments: Patient presents today with a chief complaint of a productive cough.  He states that the cough has been present for the past 3 days and is gradually worsening.  He has taken Dayquil for the cough without relief.  He also reports that he has had a headache for the past 2 days.  Headache is constant. Located in the frontal region.  Gradual in onset.  He denies head injury or trauma.  He also reports associated nausea, but no vomiting.  He denies fever, chills, vision changes, neck pain, neck stiffness, chest pain, and SOB.  He is currently intoxicated.  He states that he drank 40 ounces of beer today.  Last drink just prior to arrival.  He drinks alcohol daily.  He is not interested in alcohol detox at this time.  Denies SI or HI.  The history is provided by the patient.    Past Medical History  Diagnosis Date  . COPD (chronic obstructive pulmonary disease)   . Hypertension   . Anxiety    Past Surgical History  Procedure Laterality Date  . Hernia repair    . Tonsillectomy  1968  . Mandible fracture surgery  1981   Family History  Problem Relation Age of Onset  . COPD Mother   . Heart disease Mother   . Cancer Father [id number]    prostate   History  Substance Use Topics  . Smoking status: Current Some Day Smoker -- 1.00 packs/day    Types: Cigarettes  . Smokeless tobacco: Not on file  . Alcohol Use: Yes     Comment: daily    Review of Systems  All other systems reviewed and are negative.     Allergies  Doxycycline  Home Medications   Prior to Admission medications   Medication Sig Start Date End Date Taking? Authorizing Provider  albuterol (PROVENTIL HFA;VENTOLIN HFA) 108 (90  BASE) MCG/ACT inhaler Inhale 2 puffs into the lungs every 6 (six) hours as needed. For shortness of breath    Historical Provider, MD  ibuprofen (MOTRIN IB) 200 MG tablet Take 4 tablets (800 mg total) by mouth every 8 (eight) hours as needed. 11/21/13 11/21/14  Megan N Dort, PA-C  methocarbamol (ROBAXIN) 750 MG tablet Take 1 tablet (750 mg total) by mouth every 8 (eight) hours as needed for muscle spasms (use for muscle cramps/pain). 11/28/13   Shanker Levora DredgeM Ghimire, MD  oxyCODONE (ROXICODONE) 5 MG immediate release tablet Take 1 tablet (5 mg total) by mouth every 6 (six) hours as needed for severe pain. 11/28/13   Shanker Levora DredgeM Ghimire, MD  tiotropium (SPIRIVA HANDIHALER) 18 MCG inhalation capsule Place 1 capsule (18 mcg total) into inhaler and inhale daily. 11/28/13   Shanker Levora DredgeM Ghimire, MD   BP 105/65 mmHg  Pulse 76  Temp(Src) 97.7 F (36.5 C) (Oral)  Resp 26  SpO2 98% Physical Exam  Constitutional: He appears well-developed and well-nourished.  Patient intoxicated  HENT:  Head: Normocephalic and atraumatic.  Mouth/Throat: Oropharynx is clear and moist.  Eyes: Pupils are equal, round, and reactive to light.  Neck: Normal range of motion.  Cardiovascular: Normal rate, regular rhythm and normal heart sounds.   Pulmonary/Chest: Effort normal and breath sounds normal. No  respiratory distress. He has no wheezes. He has no rales.  Abdominal: Soft. Bowel sounds are normal.  Musculoskeletal: Normal range of motion.  Neurological: He is alert. He has normal strength. No cranial nerve deficit or sensory deficit.  Skin: Skin is warm and dry.  Psychiatric: He has a normal mood and affect.  Nursing note and vitals reviewed.   ED Course  Procedures (including critical care time) Labs Review Labs Reviewed  BASIC METABOLIC PANEL  CBC  I-STAT TROPOININ, ED    Imaging Review No results found.   EKG Interpretation   Date/Time:  Wednesday March 13 2014 19:43:32 EST Ventricular Rate:  86 PR Interval:   121 QRS Duration: 94 QT Interval:  401 QTC Calculation: 480 R Axis:   81 Text Interpretation:  Sinus rhythm Minimal ST elevation, anterolateral  leads Borderline prolonged QT interval Baseline wander in lead(s) V1 V3  Confirmed by Lincoln Brighamees, Liz [id number]) on 03/13/2014 10:14:08 PM      MDM   Final diagnoses:  None   Patient with a history of Alcoholism and COPD presents today with cough.  He is not hypoxic.  No signs of respiratory distress.  Lungs CTAB.  CXR negative.  He is intoxicated while in the ED.  Intoxication improved during ED course.  Patient able to ambulate without difficulty prior to discharge.  He denies wanting alcohol detox at this time.  Denies SI or HI.  Feel that the patient is stable for discharge.  Return precautions given.      Santiago GladHeather Lamar Naef, PA-C 03/16/14 2204  Tilden FossaElizabeth Rees, MD 03/18/14 [phone]

## 2014-03-13 NOTE — ED Notes (Signed)
Per EMS, pt called for cough and generalized weakness x 4 days. Pt reports drinking alcohol all day every day, pt is intoxicated at this time. Warm to the touch and actively coughing, non-productive.

## 2014-03-13 NOTE — ED Notes (Signed)
I stat troponin results 0.01 ng/mL

## 2014-06-03 ENCOUNTER — Encounter (HOSPITAL_COMMUNITY): Payer: Self-pay | Admitting: Emergency Medicine

## 2014-06-03 ENCOUNTER — Emergency Department (HOSPITAL_COMMUNITY)
Admission: EM | Admit: 2014-06-03 | Discharge: 2014-06-03 | Disposition: A | Discharge status: Home / Self Care | Payer: Self-pay | Attending: Emergency Medicine | Admitting: Emergency Medicine

## 2014-06-03 DIAGNOSIS — Z79899 Other long term (current) drug therapy: Secondary | ICD-10-CM | POA: Insufficient documentation

## 2014-06-03 DIAGNOSIS — Z8659 Personal history of other mental and behavioral disorders: Secondary | ICD-10-CM | POA: Insufficient documentation

## 2014-06-03 DIAGNOSIS — M5440 Lumbago with sciatica, unspecified side: Secondary | ICD-10-CM | POA: Insufficient documentation

## 2014-06-03 DIAGNOSIS — Z720 Tobacco use: Secondary | ICD-10-CM | POA: Insufficient documentation

## 2014-06-03 DIAGNOSIS — I10 Essential (primary) hypertension: Secondary | ICD-10-CM | POA: Insufficient documentation

## 2014-06-03 DIAGNOSIS — J449 Chronic obstructive pulmonary disease, unspecified: Secondary | ICD-10-CM | POA: Insufficient documentation

## 2014-06-03 MED ORDER — HYDROCODONE-ACETAMINOPHEN 5-325 MG PO TABS: 2.0000 | ORAL_TABLET | ORAL | Status: DC | PRN

## 2014-06-03 MED ORDER — CYCLOBENZAPRINE HCL 10 MG PO TABS: 10.0000 mg | ORAL_TABLET | Freq: Two times a day (BID) | ORAL | Status: DC | PRN

## 2014-06-03 MED ORDER — KETOROLAC TROMETHAMINE 60 MG/2ML IM SOLN
60.0000 mg | Freq: Once | INTRAMUSCULAR | Status: AC
  Administered 2014-06-03: 60 mg via INTRAMUSCULAR
  Filled 2014-06-03: qty 2

## 2014-06-03 MED ORDER — IBUPROFEN 800 MG PO TABS: 800.0000 mg | ORAL_TABLET | Freq: Three times a day (TID) | ORAL | Status: DC

## 2014-06-03 MED ORDER — HYDROMORPHONE HCL 1 MG/ML IJ SOLN
2.0000 mg | Freq: Once | INTRAMUSCULAR | Status: AC
  Administered 2014-06-03: 2 mg via INTRAMUSCULAR
  Filled 2014-06-03: qty 2

## 2014-06-03 NOTE — ED Notes (Signed)
Pt sts lower back pain with radiation to buttocks area while working today

## 2014-06-03 NOTE — Discharge Instructions (Signed)
Back Pain, Adult °Back pain is very common. The pain often gets better over time. The cause of back pain is usually not dangerous. Most people can learn to manage their back pain on their own.  °HOME CARE  °· Stay active. Start with short walks on flat ground if you can. Try to walk farther each day. °· Do not sit, drive, or stand in one place for more than 30 minutes. Do not stay in bed. °· Do not avoid exercise or work. Activity can help your back heal faster. °· Be careful when you bend or lift an object. Bend at your knees, keep the object close to you, and do not twist. °· Sleep on a firm mattress. Lie on your side, and bend your knees. If you lie on your back, put a pillow under your knees. °· Only take medicines as told by your doctor. °· Put ice on the injured area. °¨ Put ice in a plastic bag. °¨ Place a towel between your skin and the bag. °¨ Leave the ice on for 15-20 minutes, 03-04 times a day for the first 2 to 3 days. After that, you can switch between ice and heat packs. °· Ask your doctor about back exercises or massage. °· Avoid feeling anxious or stressed. Find good ways to deal with stress, such as exercise. °GET HELP RIGHT AWAY IF:  °· Your pain does not go away with rest or medicine. °· Your pain does not go away in 1 week. °· You have new problems. °· You do not feel well. °· The pain spreads into your legs. °· You cannot control when you poop (bowel movement) or pee (urinate). °· Your arms or legs feel weak or lose feeling (numbness). °· You feel sick to your stomach (nauseous) or throw up (vomit). °· You have belly (abdominal) pain. °· You feel like you may pass out (faint). °MAKE SURE YOU:  °· Understand these instructions. °· Will watch your condition. °· Will get help right away if you are not doing well or get worse. °Document Released: 09/01/2007 Document Revised: 06/07/2011 Document Reviewed: 07/17/2013 °Exit[employer]® Patient Information ©2015 [employer]. This information is not intended  to replace advice given to you by your health care provider. Make sure you discuss any questions you have with your health care provider. ° °

## 2014-06-03 NOTE — ED Provider Notes (Signed)
CSN: [id number]     Arrival date & time 06/03/14  1705 History  This chart was scribed for non-physician practitioner, Langston MaskerKaren Khyron Garno, PA-C working with Jerelyn ScottMartha Linker, MD by Greggory StallionKayla Andersen, ED scribe. This patient was seen in room TR05C/TR05C and the patient's care was started at 5:50 PM.    Chief Complaint  Patient presents with  . Back Pain   The history is provided by the patient. No language interpreter was used.    HPI Comments: [name] is a 54 y.o. male with history of hypertension and COPD who presents to the Emergency Department complaining of worsening lower back pain that radiates into his buttocks and left leg that started earlier today after loading a tree onto a truck. He reports history of similar back pain. Movements worsen pain. Pt has used a hot water bottle on his back with no relief. He smokes cigarettes daily but denies alcohol use.   Past Medical History  Diagnosis Date  . COPD (chronic obstructive pulmonary disease)   . Hypertension   . Anxiety    Past Surgical History  Procedure Laterality Date  . Hernia repair    . Tonsillectomy  1968  . Mandible fracture surgery  1981   Family History  Problem Relation Age of Onset  . COPD Mother   . Heart disease Mother   . Cancer Father [id number]    prostate   History  Substance Use Topics  . Smoking status: Current Some Day Smoker -- 1.00 packs/day    Types: Cigarettes  . Smokeless tobacco: Not on file  . Alcohol Use: Yes     Comment: daily (former)    Review of Systems  Musculoskeletal: Positive for myalgias and back pain.  All other systems reviewed and are negative.  Allergies  Doxycycline  Home Medications   Prior to Admission medications   Medication Sig Start Date End Date Taking? Authorizing Provider  albuterol (PROVENTIL HFA;VENTOLIN HFA) 108 (90 BASE) MCG/ACT inhaler Inhale 2 puffs into the lungs every 6 (six) hours as needed. For shortness of breath    Historical Provider, MD  benzonatate  (TESSALON) 100 MG capsule Take 1 capsule (100 mg total) by mouth every 8 (eight) hours. 03/13/14   Heather Laisure, PA-C   BP 129/84 mmHg  Pulse 88  Temp(Src) 98.2 F (36.8 C)  Resp 16  SpO2 97%   Physical Exam  Constitutional: He is oriented to person, place, and time. He appears well-developed and well-nourished. No distress.  HENT:  Head: Normocephalic and atraumatic.  Eyes: Conjunctivae and EOM are normal.  Neck: Neck supple.  Cardiovascular: Normal rate.   Pulmonary/Chest: Effort normal. No respiratory distress.  Musculoskeletal: Normal range of motion.  Diffusely tender over lumbar spine. Pain with movement.   Neurological: He is alert and oriented to person, place, and time.  Skin: Skin is warm and dry.  Psychiatric: He has a normal mood and affect. His behavior is normal.  Nursing note and vitals reviewed.   ED Course  Procedures (including critical care time)  DIAGNOSTIC STUDIES: Oxygen Saturation is 97% on RA, normal by my interpretation.    COORDINATION OF CARE: 5:51 PM-Discussed treatment plan which includes pain medication with pt at bedside and pt agreed to plan.   Labs Review Labs Reviewed - No data to display  Imaging Review No results found.   EKG Interpretation None      MDM   Final diagnoses:  Midline low back pain with sciatica, sciatica laterality unspecified  torodol Dilaudid  Hydrodone  Ibuprofen Flexeril Schedule to see the Orthopaedist for recheck in one week if pain persist.   I personally performed the services in this documentation, which was scribed in my presence.  The recorded information has been reviewed and considered.   Barnet Pall.  Lonia Skinner [place], PA-C 06/03/14 1839  Jerelyn Scott, MD 06/03/14 [phone]

## 2014-06-03 NOTE — ED Notes (Addendum)
Pt states after cutting and loading a tree earlier this afternoon he began having achy back pain, used a hot water bottle on his back which gave him no relief. Pt states he is in pain no matter what position he is in. Reports pain radiating down left leg.

## 2014-06-20 ENCOUNTER — Emergency Department (HOSPITAL_COMMUNITY)
Admission: EM | Admit: 2014-06-20 | Discharge: 2014-06-21 | Disposition: A | Discharge status: Home / Self Care | Payer: Self-pay | Attending: Emergency Medicine | Admitting: Emergency Medicine

## 2014-06-20 ENCOUNTER — Emergency Department (HOSPITAL_COMMUNITY): Payer: Self-pay

## 2014-06-20 ENCOUNTER — Encounter (HOSPITAL_COMMUNITY): Payer: Self-pay | Admitting: Emergency Medicine

## 2014-06-20 DIAGNOSIS — Y939 Activity, unspecified: Secondary | ICD-10-CM | POA: Insufficient documentation

## 2014-06-20 DIAGNOSIS — Z720 Tobacco use: Secondary | ICD-10-CM | POA: Insufficient documentation

## 2014-06-20 DIAGNOSIS — S62632A Displaced fracture of distal phalanx of right middle finger, initial encounter for closed fracture: Secondary | ICD-10-CM | POA: Insufficient documentation

## 2014-06-20 DIAGNOSIS — T07XXXA Unspecified multiple injuries, initial encounter: Secondary | ICD-10-CM

## 2014-06-20 DIAGNOSIS — J449 Chronic obstructive pulmonary disease, unspecified: Secondary | ICD-10-CM | POA: Insufficient documentation

## 2014-06-20 DIAGNOSIS — S01511A Laceration without foreign body of lip, initial encounter: Secondary | ICD-10-CM | POA: Insufficient documentation

## 2014-06-20 DIAGNOSIS — S62609A Fracture of unspecified phalanx of unspecified finger, initial encounter for closed fracture: Secondary | ICD-10-CM

## 2014-06-20 DIAGNOSIS — Y999 Unspecified external cause status: Secondary | ICD-10-CM | POA: Insufficient documentation

## 2014-06-20 DIAGNOSIS — Z79899 Other long term (current) drug therapy: Secondary | ICD-10-CM | POA: Insufficient documentation

## 2014-06-20 DIAGNOSIS — Z8659 Personal history of other mental and behavioral disorders: Secondary | ICD-10-CM | POA: Insufficient documentation

## 2014-06-20 DIAGNOSIS — S61512A Laceration without foreign body of left wrist, initial encounter: Secondary | ICD-10-CM | POA: Insufficient documentation

## 2014-06-20 DIAGNOSIS — I10 Essential (primary) hypertension: Secondary | ICD-10-CM | POA: Insufficient documentation

## 2014-06-20 DIAGNOSIS — Y929 Unspecified place or not applicable: Secondary | ICD-10-CM | POA: Insufficient documentation

## 2014-06-20 MED ORDER — HYDROCODONE-ACETAMINOPHEN 5-325 MG PO TABS
1.0000 | ORAL_TABLET | Freq: Once | ORAL | Status: AC
  Administered 2014-06-20: 1 via ORAL
  Filled 2014-06-20: qty 1

## 2014-06-20 MED ORDER — LIDOCAINE HCL (PF) 1 % IJ SOLN
10.0000 mL | Freq: Once | INTRAMUSCULAR | Status: DC
  Filled 2014-06-20: qty 10
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
## 2014-06-20 NOTE — ED Notes (Signed)
Patient attempting to leave room to go outside and smoke. RN has redirected patient back to room several times.
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
## 2014-06-21 MED ORDER — HYDROCODONE-ACETAMINOPHEN 5-325 MG PO TABS: 2.0000 | ORAL_TABLET | ORAL | Status: DC | PRN

## 2014-06-21 NOTE — Discharge Instructions (Signed)
Your stitches will dissolve on their own.  You do not need to have them removed.  Please return if you have any skin redness, fever, or other symptoms of infection.  Finger Avulsion  When the tip of the finger is lost, a new nail may grow back if part of the fingernail is left. The new nail may be deformed. If just the tip of the finger is lost, no repair may be needed unless there is bone showing. If bone is showing, your caregiver may need to remove the protruding bone and put on a bandage. Your caregiver will do what is best for you. Most of the time when a fingertip is lost, the end will gradually grow back on and look fairly normal, but it may remain sensitive to pressure and temperature extremes for a long time. HOME CARE INSTRUCTIONS   Keep your hand elevated above your heart to relieve pain and swelling.  Keep your dressing dry and clean.  Change your bandage in 24 hours or as directed.  Only take over-the-counter or prescription medicines for pain, discomfort, or fever as directed by your caregiver.  See your caregiver as needed for problems. SEEK MEDICAL CARE IF:   You have increased pain, swelling, drainage, or bleeding.  You have a fever.  You have swelling that spreads from your finger and into your hand. Make sure to check to see if you need a tetanus booster. Document Released: 05/24/2001 Document Revised: 09/14/2011 Document Reviewed: 04/18/2008 [facility] Patient Information 2015 [employer] [place]. This information is not intended to replace advice given to you by your health care provider. Make sure you discuss any questions you have with your health care provider.

## 2014-06-21 NOTE — ED Notes (Signed)
Patient refused wheelchair. RN escorted patient to exit

## 2015-01-11 ENCOUNTER — Emergency Department (HOSPITAL_COMMUNITY): Payer: Self-pay

## 2015-01-11 ENCOUNTER — Encounter (HOSPITAL_COMMUNITY): Payer: Self-pay | Admitting: Emergency Medicine

## 2015-01-11 ENCOUNTER — Emergency Department (HOSPITAL_COMMUNITY)
Admission: EM | Admit: 2015-01-11 | Discharge: 2015-01-11 | Disposition: A | Discharge status: Home / Self Care | Payer: Self-pay | Attending: Emergency Medicine | Admitting: Emergency Medicine

## 2015-01-11 DIAGNOSIS — Z79899 Other long term (current) drug therapy: Secondary | ICD-10-CM | POA: Insufficient documentation

## 2015-01-11 DIAGNOSIS — Y92828 Other wilderness area as the place of occurrence of the external cause: Secondary | ICD-10-CM | POA: Insufficient documentation

## 2015-01-11 DIAGNOSIS — X58XXXA Exposure to other specified factors, initial encounter: Secondary | ICD-10-CM | POA: Insufficient documentation

## 2015-01-11 DIAGNOSIS — S82302A Unspecified fracture of lower end of left tibia, initial encounter for closed fracture: Secondary | ICD-10-CM | POA: Insufficient documentation

## 2015-01-11 DIAGNOSIS — J449 Chronic obstructive pulmonary disease, unspecified: Secondary | ICD-10-CM | POA: Insufficient documentation

## 2015-01-11 DIAGNOSIS — Y9301 Activity, walking, marching and hiking: Secondary | ICD-10-CM | POA: Insufficient documentation

## 2015-01-11 DIAGNOSIS — I10 Essential (primary) hypertension: Secondary | ICD-10-CM | POA: Insufficient documentation

## 2015-01-11 DIAGNOSIS — S8252XA Displaced fracture of medial malleolus of left tibia, initial encounter for closed fracture: Secondary | ICD-10-CM

## 2015-01-11 DIAGNOSIS — S9002XA Contusion of left ankle, initial encounter: Secondary | ICD-10-CM | POA: Insufficient documentation

## 2015-01-11 DIAGNOSIS — Z720 Tobacco use: Secondary | ICD-10-CM | POA: Insufficient documentation

## 2015-01-11 DIAGNOSIS — Z791 Long term (current) use of non-steroidal anti-inflammatories (NSAID): Secondary | ICD-10-CM | POA: Insufficient documentation

## 2015-01-11 DIAGNOSIS — Y999 Unspecified external cause status: Secondary | ICD-10-CM | POA: Insufficient documentation

## 2015-01-11 DIAGNOSIS — Z8659 Personal history of other mental and behavioral disorders: Secondary | ICD-10-CM | POA: Insufficient documentation

## 2015-01-11 MED ORDER — OXYCODONE-ACETAMINOPHEN 5-325 MG PO TABS
2.0000 | ORAL_TABLET | Freq: Once | ORAL | Status: AC
  Administered 2015-01-11: 2 via ORAL
  Filled 2015-01-11: qty 2

## 2015-01-11 MED ORDER — OXYCODONE-ACETAMINOPHEN 5-325 MG PO TABS: 1.0000 | ORAL_TABLET | ORAL | Status: DC | PRN

## 2015-01-11 NOTE — ED Notes (Signed)
Ortho tech returned the call

## 2015-01-11 NOTE — Discharge Instructions (Signed)
Take the prescribed medication as directed for pain.  Leave splint in place until follow-up. Follow-up with Dr. Ophelia CharterYates-- he will see you in clinic on Tuesday. Return to the ED for new or worsening symptoms.

## 2015-01-11 NOTE — ED Notes (Signed)
Walking outside , up a hill last night and rolled left ankle.  Patient reports he was wearing work boots.  Patient unable to bear weight.  Pedal pulse 2 plus.  able to wiggle toes

## 2015-01-11 NOTE — Progress Notes (Signed)
Orthopedic Tech Progress Note Patient Details:  Esmond Plantsimothy L Rayner 1961/01/13 [id number]  Ortho Devices Type of Ortho Device: Ace wrap, Post (short leg) splint Ortho Device/Splint Interventions: Application   Saul FordyceJennifer C Ryliegh Mcduffey 01/11/2015, 12:22 PM

## 2015-01-11 NOTE — ED Provider Notes (Signed)
CSN: [id number]     Arrival date & time 01/11/15  0917 History  By signing my name below, I, [name], attest that this documentation has been prepared under the direction and in the presence of Sharilyn Sites, PA-C Electronically Signed: Jarvis Adams, ED Scribe. 01/11/2015. 11:15 AM.     Chief Complaint  Patient presents with  . Ankle Pain    The history is provided by the patient. No language interpreter was used.    HPI Comments: [name] is a 54 y.o. male who presents to the Emergency Department complaining of constant, moderate, left ankle pain onset last night s/p left ankle injury. Pt states he was walking up a hill outside last night and rolled his left ankle. No head injury or LOC.  He reports he was wearing work boots at the time. He endorses associated swelling and bruising to the ankle. He has not had any meds PTA. Pt states the pain is exacerbated with any weight bearing and that he is unable to ambulate due to the pain. He denies any h/o prior injury to the ankle. Pt denies any numbness, weakness or sensation loss.  No intervention tried PTA.  VSS.  Past Medical History  Diagnosis Date  . COPD (chronic obstructive pulmonary disease) (HCC)   . Hypertension   . Anxiety    Past Surgical History  Procedure Laterality Date  . Hernia repair    . Tonsillectomy  1968  . Mandible fracture surgery  1981   Family History  Problem Relation Age of Onset  . COPD Mother   . Heart disease Mother   . Cancer Father 64    prostate   Social History  Substance Use Topics  . Smoking status: Current Some Day Smoker -- 1.00 packs/day    Types: Cigarettes  . Smokeless tobacco: None  . Alcohol Use: Yes     Comment: daily (former)    Review of Systems  Musculoskeletal: Positive for joint swelling and arthralgias.  Skin: Positive for color change.  All other systems reviewed and are negative.     Allergies  Doxycycline  Home Medications   Prior to Admission  medications   Medication Sig Start Date End Date Taking? Authorizing Provider  albuterol (PROVENTIL HFA;VENTOLIN HFA) 108 (90 BASE) MCG/ACT inhaler Inhale 2 puffs into the lungs every 6 (six) hours as needed. For shortness of breath   Yes Historical Provider, MD  benzonatate (TESSALON) 100 MG capsule Take 1 capsule (100 mg total) by mouth every 8 (eight) hours. 03/13/14   Heather Laisure, PA-C  cyclobenzaprine (FLEXERIL) 10 MG tablet Take 1 tablet (10 mg total) by mouth 2 (two) times daily as needed for muscle spasms. 06/03/14   Elson Areas, PA-C  HYDROcodone-acetaminophen (NORCO/VICODIN) 5-325 MG per tablet Take 2 tablets by mouth every 4 (four) hours as needed. 06/21/14   Silas Flood, MD  ibuprofen (ADVIL,MOTRIN) 800 MG tablet Take 1 tablet (800 mg total) by mouth 3 (three) times daily. 06/03/14   Elson Areas, PA-C   Triage Vitals: BP 148/89 mmHg  Pulse 93  Temp(Src) 98.8 F (37.1 C) (Oral)  Resp 20  SpO2 97%  Physical Exam  Constitutional: He is oriented to person, place, and time. He appears well-developed and well-nourished.  HENT:  Head: Normocephalic and atraumatic.  Mouth/Throat: Oropharynx is clear and moist.  Eyes: Conjunctivae and EOM are normal. Pupils are equal, round, and reactive to light.  Neck: Normal range of motion.  Cardiovascular: Normal rate, regular rhythm  and normal heart sounds.   Pulmonary/Chest: Breath sounds normal. No respiratory distress. He has no wheezes.  Musculoskeletal: Normal range of motion.  Left ankle with tenderness along medial malleolus, swelling and bruising noted; limited range of motion due to pain and swelling; moving all toes appropriately; normal sensation throughout foot; compartments soft, easily compressible  Neurological: He is alert and oriented to person, place, and time.  Skin: Skin is warm and dry.  Psychiatric: He has a normal mood and affect.  Nursing note and vitals reviewed.   ED Course  ORTHOPEDIC INJURY  TREATMENT Date/Time: 01/11/2015 12:47 PM Performed by: Garlon Hatchet Authorized by: Garlon Hatchet Consent: Verbal consent obtained. Risks and benefits: risks, benefits and alternatives were discussed Consent given by: patient Patient understanding: patient states understanding of the procedure being performed Required items: required blood products, implants, devices, and special equipment available Patient identity confirmed: verbally with patient Pre-procedure neurovascular assessment: neurovascularly intact Immobilization: splint Splint type: short leg Supplies used: Ortho-Glass Post-procedure neurovascular assessment: post-procedure neurovascularly intact Patient tolerance: Patient tolerated the procedure well with no immediate complications   (including critical care time)  DIAGNOSTIC STUDIES: Oxygen Saturation is 97% on RA, normal by my interpretation.    COORDINATION OF CARE:  10:16 AM- Will order imaging of left ankle and left tib/fib. Pt advised of plan for treatment and pt agrees.  Labs Review Labs Reviewed - No data to display  Imaging Review Dg Tibia/fibula Left  01/11/2015  CLINICAL DATA:  Fracture EXAM: LEFT TIBIA AND FIBULA - 2 VIEW COMPARISON:  Left ankle same day FINDINGS: Three views of the left tibia fibula submitted. Again noted mild displaced fracture of distal tibia medial malleolus. No other fractures are identified. Knee joint is unremarkable. IMPRESSION: Again noted mild displaced fracture distal tibia medial malleolus. Electronically Signed   By: Natasha Mead M.D.   On: 01/11/2015 10:51   Dg Ankle Complete Left  01/11/2015  CLINICAL DATA:  Post fall walking dog yesterday now with pain involving the entirety of the ankle. EXAM: LEFT ANKLE COMPLETE - 3+ VIEW COMPARISON:  None. FINDINGS: There is a horizontally oriented, complete, minimally displaced fracture involving the medial malleolus with extension to involve the tibiotalar joint. This finding is  without definitive widening of the ankle mortise. Small ankle joint effusion. Expected adjacent soft tissue swelling about the entirety of the ankle, most conspicuous about the medial malleolus. No radiopaque foreign body. Small plantar calcaneal spur. Note is made of a os tibialis externum. IMPRESSION: Complete, minimally displaced fracture of the medial malleolus with extension to the tibiotalar joint but without definitive widening of the ankle mortise. As this fracture can be associated with an additional fracture involving the proximal fibula (Maisonneuve fracture), further evaluation with left tibia and fibular radiographs is recommended. Electronically Signed   By: Simonne Come M.D.   On: 01/11/2015 10:11   I have personally reviewed and evaluated these images and lab results as part of my medical decision-making.   EKG Interpretation None      MDM   Final diagnoses:  Closed displaced fracture of medial malleolus of left tibia, initial encounter   54 year old male here with left ankle injury that occurred yesterday evening. There is swelling and bruising noted on exam. Limited range of motion due to pain. Foot is neurovascularly intact. Compartments are soft, easily compressible. X-ray was obtained revealing displaced fracture of medial malleolus.  Ankle mortise preserved. Given nature of injury and likely need for surgical repair in the future  orthopedics was consulted, Dr. Ophelia CharterYates, recommend splint and office follow-up on Tuesday. This plan was discussed with patient, he acknowledged understanding. Percocet for pain control.  Discussed plan with patient, he/she acknowledged understanding and agreed with plan of care.  Return precautions given for new or worsening symptoms.  I personally performed the services described in this documentation, which was scribed in my presence. The recorded information has been reviewed and is accurate.  Garlon HatchetLisa M Jehiel Koepp, PA-C 01/11/15 1252  Margarita Grizzleanielle Ray,  MD 01/13/15 1323

## 2015-01-15 ENCOUNTER — Other Ambulatory Visit (HOSPITAL_COMMUNITY): Payer: Self-pay | Admitting: Orthopaedic Surgery

## 2015-01-16 ENCOUNTER — Encounter (HOSPITAL_COMMUNITY): Payer: Self-pay | Admitting: *Deleted

## 2015-01-19 MED ORDER — CHLORHEXIDINE GLUCONATE 4 % EX LIQD: 60.0000 mL | Freq: Once | CUTANEOUS | Status: DC

## 2015-01-19 MED ORDER — CEFAZOLIN SODIUM-DEXTROSE 2-3 GM-% IV SOLR
2.0000 g | INTRAVENOUS | Status: AC
  Administered 2015-01-20: 2 g via INTRAVENOUS
  Filled 2015-01-19: qty 50

## 2015-01-20 ENCOUNTER — Ambulatory Visit (HOSPITAL_COMMUNITY)
Admission: RE | Admit: 2015-01-20 | Discharge: 2015-01-20 | Disposition: A | Discharge status: Home / Self Care | Payer: Self-pay | Source: Ambulatory Visit | Attending: Orthopaedic Surgery | Admitting: Orthopaedic Surgery

## 2015-01-20 ENCOUNTER — Encounter (HOSPITAL_COMMUNITY)
Admission: RE | Disposition: A | Discharge status: Home / Self Care | Payer: Self-pay | Source: Ambulatory Visit | Attending: Orthopaedic Surgery

## 2015-01-20 ENCOUNTER — Encounter (HOSPITAL_COMMUNITY): Payer: Self-pay | Admitting: Anesthesiology

## 2015-01-20 ENCOUNTER — Ambulatory Visit (HOSPITAL_COMMUNITY): Payer: Self-pay | Admitting: Anesthesiology

## 2015-01-20 DIAGNOSIS — Z79899 Other long term (current) drug therapy: Secondary | ICD-10-CM | POA: Insufficient documentation

## 2015-01-20 DIAGNOSIS — Z8614 Personal history of Methicillin resistant Staphylococcus aureus infection: Secondary | ICD-10-CM | POA: Insufficient documentation

## 2015-01-20 DIAGNOSIS — I10 Essential (primary) hypertension: Secondary | ICD-10-CM | POA: Insufficient documentation

## 2015-01-20 DIAGNOSIS — F17210 Nicotine dependence, cigarettes, uncomplicated: Secondary | ICD-10-CM | POA: Insufficient documentation

## 2015-01-20 DIAGNOSIS — J449 Chronic obstructive pulmonary disease, unspecified: Secondary | ICD-10-CM | POA: Insufficient documentation

## 2015-01-20 DIAGNOSIS — S8252XA Displaced fracture of medial malleolus of left tibia, initial encounter for closed fracture: Secondary | ICD-10-CM | POA: Insufficient documentation

## 2015-01-20 DIAGNOSIS — X58XXXA Exposure to other specified factors, initial encounter: Secondary | ICD-10-CM | POA: Insufficient documentation

## 2015-01-20 DIAGNOSIS — J45909 Unspecified asthma, uncomplicated: Secondary | ICD-10-CM | POA: Insufficient documentation

## 2015-01-20 HISTORY — DX: Headache: R51

## 2015-01-20 HISTORY — PX: ORIF ANKLE FRACTURE: SHX5408

## 2015-01-20 HISTORY — DX: Major depressive disorder, single episode, unspecified: F32.9

## 2015-01-20 HISTORY — DX: Headache, unspecified: R51.9

## 2015-01-20 HISTORY — DX: Depression, unspecified: F32.A

## 2015-01-20 HISTORY — DX: Unspecified asthma, uncomplicated: J45.909

## 2015-01-20 LAB — CBC
HCT: 44.3 % (ref 39.0–52.0)
Hemoglobin: 15.5 g/dL (ref 13.0–17.0)
MCH: 34.1 pg — AB (ref 26.0–34.0)
MCHC: 35 g/dL (ref 30.0–36.0)
MCV: 97.4 fL (ref 78.0–100.0)
PLATELETS: 272 10*3/uL (ref 150–400)
RBC: 4.55 MIL/uL (ref 4.22–5.81)
RDW: 11.8 % (ref 11.5–15.5)
WBC: 5.9 10*3/uL (ref 4.0–10.5)

## 2015-01-20 LAB — COMPREHENSIVE METABOLIC PANEL
ALT: 91 U/L — AB (ref 17–63)
ANION GAP: 13 (ref 5–15)
AST: 101 U/L — ABNORMAL HIGH (ref 15–41)
Albumin: 3.9 g/dL (ref 3.5–5.0)
Alkaline Phosphatase: 65 U/L (ref 38–126)
BUN: <5 mg/dL — ABNORMAL LOW (ref 6–20)
CHLORIDE: 103 mmol/L (ref 101–111)
CO2: 22 mmol/L (ref 22–32)
CREATININE: 0.68 mg/dL (ref 0.61–1.24)
Calcium: 9.6 mg/dL (ref 8.9–10.3)
Glucose, Bld: 116 mg/dL — ABNORMAL HIGH (ref 65–99)
Potassium: 4.3 mmol/L (ref 3.5–5.1)
Sodium: 138 mmol/L (ref 135–145)
Total Bilirubin: 0.7 mg/dL (ref 0.3–1.2)
Total Protein: 7 g/dL (ref 6.5–8.1)

## 2015-01-20 LAB — PROTIME-INR
INR: 1.03 (ref 0.00–1.49)
PROTHROMBIN TIME: 13.7 s (ref 11.6–15.2)

## 2015-01-20 LAB — SURGICAL PCR SCREEN
MRSA, PCR: NEGATIVE
Staphylococcus aureus: NEGATIVE

## 2015-01-20 SURGERY — OPEN REDUCTION INTERNAL FIXATION (ORIF) ANKLE FRACTURE
Anesthesia: General | Laterality: Left

## 2015-01-20 MED ORDER — ONDANSETRON HCL 4 MG/2ML IJ SOLN
INTRAMUSCULAR | Status: AC
  Filled 2015-01-20: qty 2

## 2015-01-20 MED ORDER — PROMETHAZINE HCL 25 MG/ML IJ SOLN: 6.2500 mg | INTRAMUSCULAR | Status: DC | PRN

## 2015-01-20 MED ORDER — ASPIRIN EC 325 MG PO TBEC: 325.0000 mg | DELAYED_RELEASE_TABLET | Freq: Every day | ORAL | Status: DC

## 2015-01-20 MED ORDER — MIDAZOLAM HCL 5 MG/5ML IJ SOLN
INTRAMUSCULAR | Status: DC | PRN
  Administered 2015-01-20 (×2): 1 mg via INTRAVENOUS

## 2015-01-20 MED ORDER — BUPIVACAINE HCL (PF) 0.25 % IJ SOLN
INTRAMUSCULAR | Status: DC | PRN
  Administered 2015-01-20: 10 mL

## 2015-01-20 MED ORDER — HYDROMORPHONE HCL 1 MG/ML IJ SOLN
INTRAMUSCULAR | Status: AC
  Administered 2015-01-20: 0.5 mg via INTRAVENOUS
  Filled 2015-01-20: qty 1

## 2015-01-20 MED ORDER — LIDOCAINE HCL (CARDIAC) 20 MG/ML IV SOLN
INTRAVENOUS | Status: AC
  Filled 2015-01-20: qty 5

## 2015-01-20 MED ORDER — OXYCODONE-ACETAMINOPHEN 5-325 MG PO TABS
ORAL_TABLET | ORAL | Status: AC
  Filled 2015-01-20: qty 2

## 2015-01-20 MED ORDER — PROPOFOL 10 MG/ML IV BOLUS
INTRAVENOUS | Status: AC
  Filled 2015-01-20: qty 20

## 2015-01-20 MED ORDER — 0.9 % SODIUM CHLORIDE (POUR BTL) OPTIME
TOPICAL | Status: DC | PRN
  Administered 2015-01-20: 1000 mL

## 2015-01-20 MED ORDER — MUPIROCIN 2 % EX OINT
TOPICAL_OINTMENT | CUTANEOUS | Status: AC
Start: 2015-01-20 — End: 2015-01-20
  Administered 2015-01-20: 14:00:00
  Filled 2015-01-20: qty 22

## 2015-01-20 MED ORDER — MIDAZOLAM HCL 2 MG/2ML IJ SOLN
INTRAMUSCULAR | Status: AC
  Filled 2015-01-20: qty 4

## 2015-01-20 MED ORDER — LIDOCAINE HCL (CARDIAC) 20 MG/ML IV SOLN
INTRAVENOUS | Status: DC | PRN
  Administered 2015-01-20: 80 mg via INTRAVENOUS

## 2015-01-20 MED ORDER — FENTANYL CITRATE (PF) 100 MCG/2ML IJ SOLN
INTRAMUSCULAR | Status: DC | PRN
  Administered 2015-01-20 (×5): 50 ug via INTRAVENOUS

## 2015-01-20 MED ORDER — OXYCODONE-ACETAMINOPHEN 5-325 MG PO TABS: 1.0000 | ORAL_TABLET | Freq: Four times a day (QID) | ORAL | Status: DC | PRN

## 2015-01-20 MED ORDER — OXYCODONE-ACETAMINOPHEN 5-325 MG PO TABS
2.0000 | ORAL_TABLET | Freq: Once | ORAL | Status: AC
  Administered 2015-01-20: 2 via ORAL

## 2015-01-20 MED ORDER — HYDROMORPHONE HCL 1 MG/ML IJ SOLN
0.2500 mg | INTRAMUSCULAR | Status: DC | PRN
  Administered 2015-01-20 (×4): 0.5 mg via INTRAVENOUS

## 2015-01-20 MED ORDER — MIDAZOLAM HCL 2 MG/2ML IJ SOLN
INTRAMUSCULAR | Status: AC
  Filled 2015-01-20: qty 2

## 2015-01-20 MED ORDER — LACTATED RINGERS IV SOLN
INTRAVENOUS | Status: DC | PRN
  Administered 2015-01-20 (×2): via INTRAVENOUS

## 2015-01-20 MED ORDER — FENTANYL CITRATE (PF) 100 MCG/2ML IJ SOLN
INTRAMUSCULAR | Status: AC
  Filled 2015-01-20: qty 2

## 2015-01-20 MED ORDER — FENTANYL CITRATE (PF) 250 MCG/5ML IJ SOLN
INTRAMUSCULAR | Status: AC
  Filled 2015-01-20: qty 5

## 2015-01-20 MED ORDER — ONDANSETRON HCL 4 MG/2ML IJ SOLN
INTRAMUSCULAR | Status: DC | PRN
  Administered 2015-01-20: 4 mg via INTRAVENOUS

## 2015-01-20 MED ORDER — LACTATED RINGERS IV SOLN
INTRAVENOUS | Status: DC
  Administered 2015-01-20: 14:00:00 via INTRAVENOUS

## 2015-01-20 MED ORDER — PROPOFOL 10 MG/ML IV BOLUS
INTRAVENOUS | Status: DC | PRN
  Administered 2015-01-20: 150 mg via INTRAVENOUS
  Administered 2015-01-20 (×3): 50 mg via INTRAVENOUS

## 2015-01-20 SURGICAL SUPPLY — 56 items
BANDAGE ELASTIC 4 VELCRO ST LF (GAUZE/BANDAGES/DRESSINGS) ×2 IMPLANT
BANDAGE ELASTIC 6 VELCRO ST LF (GAUZE/BANDAGES/DRESSINGS) ×2 IMPLANT
BANDAGE ESMARK 6X9 LF (GAUZE/BANDAGES/DRESSINGS) IMPLANT
BIT DRILL 2.7XCANN QCK CNCT (BIT) IMPLANT
BIT DRILL CANN 2.7 (BIT) ×2
BIT DRILL CANN 2.7MM (BIT) ×1
BIT DRL 2.7XCANN QCK CNCT (BIT) ×1
BNDG CMPR 9X6 STRL LF SNTH (GAUZE/BANDAGES/DRESSINGS) ×1
BNDG ESMARK 6X9 LF (GAUZE/BANDAGES/DRESSINGS) ×3
COVER MAYO STAND STRL (DRAPES) ×3 IMPLANT
COVER SURGICAL LIGHT HANDLE (MISCELLANEOUS) ×3 IMPLANT
CUFF TOURNIQUET SINGLE 34IN LL (TOURNIQUET CUFF) ×2 IMPLANT
CUFF TOURNIQUET SINGLE 44IN (TOURNIQUET CUFF) IMPLANT
DRAPE C-ARM 42X72 X-RAY (DRAPES) IMPLANT
DRAPE INCISE IOBAN 66X45 STRL (DRAPES) ×3 IMPLANT
DRAPE PROXIMA HALF (DRAPES) ×3 IMPLANT
DRAPE U-SHAPE 47X51 STRL (DRAPES) ×3 IMPLANT
DRSG PAD ABDOMINAL 8X10 ST (GAUZE/BANDAGES/DRESSINGS) ×3 IMPLANT
DURAPREP 26ML APPLICATOR (WOUND CARE) ×3 IMPLANT
ELECT REM PT RETURN 9FT ADLT (ELECTROSURGICAL) ×3
ELECTRODE REM PT RTRN 9FT ADLT (ELECTROSURGICAL) ×1 IMPLANT
GAUZE SPONGE 4X4 12PLY STRL (GAUZE/BANDAGES/DRESSINGS) ×3 IMPLANT
GAUZE XEROFORM 5X9 LF (GAUZE/BANDAGES/DRESSINGS) ×3 IMPLANT
GLOVE BIOGEL PI IND STRL 8 (GLOVE) ×2 IMPLANT
GLOVE BIOGEL PI INDICATOR 8 (GLOVE) ×4
GLOVE ORTHO TXT STRL SZ7.5 (GLOVE) ×6 IMPLANT
GOWN STRL REUS W/ TWL LRG LVL3 (GOWN DISPOSABLE) ×1 IMPLANT
GOWN STRL REUS W/ TWL XL LVL3 (GOWN DISPOSABLE) ×1 IMPLANT
GOWN STRL REUS W/TWL 2XL LVL3 (GOWN DISPOSABLE) ×3 IMPLANT
GOWN STRL REUS W/TWL LRG LVL3 (GOWN DISPOSABLE) ×3
GOWN STRL REUS W/TWL XL LVL3 (GOWN DISPOSABLE) ×3
K-WIRE SMOOTH 1.6 (WIRE) ×4 IMPLANT
KIT BASIN OR (CUSTOM PROCEDURE TRAY) ×3 IMPLANT
KIT ROOM TURNOVER OR (KITS) ×3 IMPLANT
MANIFOLD NEPTUNE II (INSTRUMENTS) ×1 IMPLANT
NS IRRIG 1000ML POUR BTL (IV SOLUTION) ×3 IMPLANT
PACK ORTHO EXTREMITY (CUSTOM PROCEDURE TRAY) ×3 IMPLANT
PAD ARMBOARD 7.5X6 YLW CONV (MISCELLANEOUS) ×6 IMPLANT
PAD CAST 4YDX4 CTTN HI CHSV (CAST SUPPLIES) ×1 IMPLANT
PADDING CAST COTTON 4X4 STRL (CAST SUPPLIES) ×3
PADDING CAST COTTON 6X4 STRL (CAST SUPPLIES) ×3 IMPLANT
SCREW PT CANNULATED 3.5X40 (Screw) ×4 IMPLANT
SPLINT FIBERGLASS 4X30 (CAST SUPPLIES) ×4 IMPLANT
SPONGE GAUZE 4X4 12PLY STER LF (GAUZE/BANDAGES/DRESSINGS) ×2 IMPLANT
SPONGE LAP 18X18 X RAY DECT (DISPOSABLE) ×3 IMPLANT
STAPLER VISISTAT 35W (STAPLE) ×2 IMPLANT
SUCTION FRAZIER TIP 10 FR DISP (SUCTIONS) ×1 IMPLANT
SUT ETHILON 3 0 PS 1 (SUTURE) ×6 IMPLANT
SUT VIC AB 2-0 CT1 27 (SUTURE) ×6
SUT VIC AB 2-0 CT1 TAPERPNT 27 (SUTURE) ×2 IMPLANT
TOWEL OR 17X24 6PK STRL BLUE (TOWEL DISPOSABLE) ×3 IMPLANT
TOWEL OR 17X26 10 PK STRL BLUE (TOWEL DISPOSABLE) ×3 IMPLANT
TUBE CONNECTING 12'X1/4 (SUCTIONS) ×1
TUBE CONNECTING 12X1/4 (SUCTIONS) ×2 IMPLANT
WATER STERILE IRR 1000ML POUR (IV SOLUTION) ×3 IMPLANT
YANKAUER SUCT BULB TIP NO VENT (SUCTIONS) ×3 IMPLANT

## 2015-01-20 NOTE — Anesthesia Postprocedure Evaluation (Signed)
  Anesthesia Post-op Note  Patient: [name]  Procedure(s) Performed: Procedure(s): OPEN REDUCTION INTERNAL FIXATION (ORIF) LEFT MEDIAL MALLEOLUS (Left)  Patient Location: PACU  Anesthesia Type: General   Level of Consciousness: awake, alert  and oriented  Airway and Oxygen Therapy: Patient Spontanous Breathing  Post-op Pain: mild  Post-op Assessment: Post-op Vital signs reviewed  Post-op Vital Signs: Reviewed  Last Vitals:  Filed Vitals:   01/20/15 1705  BP:   Pulse:   Temp: 36.8 C  Resp:     Complications: No apparent anesthesia complications

## 2015-01-20 NOTE — Anesthesia Procedure Notes (Signed)
Procedure Name: LMA Insertion Date/Time: 01/20/2015 3:08 PM Performed by: Leonel Ramsay'LAUGHLIN, Monalisa Bayless H Pre-anesthesia Checklist: Patient identified, Emergency Drugs available, Suction available, Patient being monitored and Timeout performed Patient Re-evaluated:Patient Re-evaluated prior to inductionOxygen Delivery Method: Circle system utilized Preoxygenation: Pre-oxygenation with 100% oxygen Intubation Type: IV induction LMA: LMA inserted LMA Size: 5.0 Placement Confirmation: positive ETCO2 and breath sounds checked- equal and bilateral Tube secured with: Tape Dental Injury: Teeth and Oropharynx as per pre-operative assessment

## 2015-01-20 NOTE — Op Note (Signed)
NAMSalome Arnt:  Norris, Tarry             ACCOUNT NO.:  [id number]  MEDICAL RECORD NO.:  [id number]  LOCATION:  [facility]                         FACILITY:  [facility]  PHYSICIAN:  Shamya Macfadden C. Ophelia CharterYates, M.D.    DATE OF BIRTH:  01/11/1961  DATE OF PROCEDURE:  01/20/2015 DATE OF DISCHARGE:  01/20/2015                              OPERATIVE REPORT   PREOPERATIVE DIAGNOSIS:  Displaced medial malleolar fracture.  POSTOPERATIVE DIAGNOSIS:  Displaced medial malleolar fracture.  PROCEDURE:  Open reduction and internal fixation of left medial malleolus.  SURGEON:  Ann Bohne C. Ophelia CharterYates, M.D.  TOURNIQUET TIME:  25 minutes x350.  INDICATION FOR PROCEDURE:  A 54 year old male who lives with his brother, currently not working, with history of MRSA, had a medial malleolar fracture with an injury in the medial malleolus, fragment was flipped 90 degrees from its normal position tenting the skin.  He was splinted, at times splint he has worn through the bottom has been replaced in the office just in a few days waiting for surgery; and now, he comes in for stabilization.  PROCEDURE IN DETAIL:  A standard prepping and draping was performed. The old splint head was terribly worn, dirty, and partially shredded where he had been walking on the ankle with an Ace wrap.  Area was shaved just adjacent to the medial malleolus with clippers.  Prepping with DuraPrep was performed with a proximal thigh tourniquet.  Usual extremity sheets and drapes.  Time-out procedure.  Preoperative Ancef prophylaxis given.  He has a PCR on this admission that was negative.  A small C-shaped incision was made over the medial malleolus.  Fracture was reduced.  K-wires were placed x2.  I drilled.  They were removed, adjusted, replaced; and when the screws were in good position, with the draped C-arm, drilling of the distal fragment and then placement of a 40 mm partially threaded cancellous lag screws were placed, stainless steel.  There were tightened  down securely.  They were checked on fluoroscopy, AP, lateral, and mortise with good position and alignment. Tourniquet deflated; irrigation; 2-0 Vicryl in the subcutaneous tissue, skin with staple closure, and then a short-leg splint with Xeroform, 4x4s, ABD, Webril, fiberglass 5 x 30, more Webril, and a six-inch Ace wrap x2.  The patient tolerated the procedure well, transferred to the recovery room in stable condition.     Cipriano Millikan C. Ophelia CharterYates, M.D.     MCY/MEDQ  D:  01/20/2015  T:  01/20/2015  Job:  [id number]

## 2015-01-20 NOTE — Anesthesia Preprocedure Evaluation (Addendum)
Anesthesia Evaluation  Patient identified by MRN, date of birth, ID band Patient awake    Reviewed: Allergy & Precautions, NPO status , Patient's Chart, lab work & pertinent test results  Airway Mallampati: I       Dental  (+) Poor Dentition, Missing,    Pulmonary asthma , COPD,  COPD inhaler, Current Smoker,     (-) decreased breath sounds+ wheezing      Cardiovascular hypertension,  Rhythm:Regular Rate:Normal     Neuro/Psych  Headaches, PSYCHIATRIC DISORDERS Anxiety Depression    GI/Hepatic negative GI ROS, Neg liver ROS,   Endo/Other  negative endocrine ROS  Renal/GU negative Renal ROS  negative genitourinary   Musculoskeletal negative musculoskeletal ROS (+)   Abdominal   Peds negative pediatric ROS (+)  Hematology negative hematology ROS (+)   Anesthesia Other Findings   Reproductive/Obstetrics negative OB ROS                            Anesthesia Physical Anesthesia Plan  ASA: II  Anesthesia Plan: General   Post-op Pain Management:    Induction: Intravenous  Airway Management Planned: LMA  Additional Equipment:   Intra-op Plan:   Post-operative Plan: Extubation in OR  Informed Consent: I have reviewed the patients History and Physical, chart, labs and discussed the procedure including the risks, benefits and alternatives for the proposed anesthesia with the patient or authorized representative who has indicated his/her understanding and acceptance.   Dental advisory given  Plan Discussed with: CRNA  Anesthesia Plan Comments: (Pt refused block.)       Anesthesia Quick Evaluation

## 2015-01-20 NOTE — Interval H&P Note (Signed)
History and Physical Interval Note:  01/20/2015 2:51 PM  [name]  has presented today for surgery, with the diagnosis of Left Ankle Medial Malleolus Fracture  The various methods of treatment have been discussed with the patient and family. After consideration of risks, benefits and other options for treatment, the patient has consented to  Procedure(s): OPEN REDUCTION INTERNAL FIXATION (ORIF) LEFT MEDIAL MALLEOLUS (Left) as a surgical intervention .  The patient's history has been reviewed, patient examined, no change in status, stable for surgery.  I have reviewed the patient's chart and labs.  Questions were answered to the patient's satisfaction.     Zabella Wease C

## 2015-01-20 NOTE — H&P (Signed)
[name] is an 54 y.o. male.    A 54 year old male injured on 01/10/2015, four days ago, and was referred from the emergency room.  He was out walking his dog, stepped on a root, twisted his ankle, and was unable to walk.  X-ray demonstrated displaced rotated medial malleolar fracture with the fragment rotated greater than 90 degrees.  He is not able to weight bear.  He is nonweightbearing using crutches.  Taking oxycodone.  He states he is almost out of the 20 tablets he was given.  No past history of injury to his ankle.  He denies fever or chills.  He has been trying to keep his foot elevated, but not above his heart.   MEDICATIONS:  Albuterol inhaler p.r.n.    ALLERGIES:  Doxycycline.   PAST SURGICAL HISTORY:  Previous right middle finger surgery.   FAMILY HISTORY:   Positive for diabetes, heart disease, lung disease.   SOCIAL HISTORY:  He has done past work in [employer]; he states some of the jobs he did in [place] and was not paid.  The patient is divorced.  Smokes 1 pack per day.  Drinks alcohol occasionally.  Denies any excessive alcohol consumption.   REVIEW OF SYSTEMS:  Positive for anxiety, asthma, hypertension, and COPD.    Past Medical History  Diagnosis Date  . COPD (chronic obstructive pulmonary disease) (HCC)   . Anxiety   . Hypertension     not on medication  . Headache     Migraines- goes to ED for injection  . Asthma   . Depression     Past Surgical History  Procedure Laterality Date  . Hernia repair Left   . Tonsillectomy  1968  . Mandible fracture surgery  1981  . Finger surgery Right     right middle    Family History  Problem Relation Age of Onset  . COPD Mother   . Heart disease Mother   . Cancer Father 55    prostate   Social History:  reports that he has been smoking Cigarettes.  He has a 47 pack-year smoking history. He does not have any smokeless tobacco history on file. He reports that he drinks about 3.6 oz of alcohol per week. He  reports that he does not use illicit drugs.  Allergies:  Allergies  Allergen Reactions  . Doxycycline Itching    No prescriptions prior to admission    No results found for this or any previous visit (from the past 48 hour(s)). No results found.  Review of Systems  Constitutional: Negative.   HENT: Negative.   Respiratory: Negative.   Cardiovascular: Negative.   Gastrointestinal: Negative.   Psychiatric/Behavioral: Negative.     There were no vitals taken for this visit. Physical Exam  Constitutional: No distress.  HENT:  Head: Atraumatic.  Eyes: EOM are normal.  Neck: Normal range of motion.  Cardiovascular: Normal rate.   Respiratory: No respiratory distress.  GI: He exhibits no distension.  Skin: Skin is warm and dry.  Psychiatric: He has a normal mood and affect.     PHYSICAL EXAMINATION:  The patient is 5 feet 7-1/2 inches tall, 180 pounds.  Alert and oriented.  No supraclavicular lymphadenopathy.  No audible wheezing.  Heart is regular.  Pulse is regular.  No murmurs.  No abdominal tenderness.  Hip and knee range of motion is full.  No rash over exposed skin.  Left lower extremity splint.    RADIOGRAPHS:  X-rays demonstrate rotated medial  malleolar fracture with 90 degree rotation of the fragment and it is even with the distal tibial articular surface.   ASSESSMENT:  Unstable ankle with medial malleolar fracture.   PLAN:  He will require open reduction internal fixation with 2 screws in the medial malleolus.  He would like to have this done as an outpatient.  He currently does not have any insurance; is not employed.  He would like to minimize cost as best he can.  Will [facility] services for review.  The plan will be general anesthesia, ORIF medial malleolus.  Planned procedure discussed.  Risks of surgery discussed.  All questions answered.  He understands and agrees to proceed.  We discussed without surgery he is not going to have a stable ankle and  is not going to be able to ambulate without instability.  Would not be able to work again, which is his goal.  Questions listed and answered. [name] 01/20/2015, 7:24 AM

## 2015-01-20 NOTE — Transfer of Care (Signed)
Immediate Anesthesia Transfer of Care Note  Patient: [name]  Procedure(s) Performed: Procedure(s): OPEN REDUCTION INTERNAL FIXATION (ORIF) LEFT MEDIAL MALLEOLUS (Left)  Patient Location: PACU  Anesthesia Type:General  Level of Consciousness: awake, alert  and oriented  Airway & Oxygen Therapy: Patient Spontanous Breathing and Patient connected to nasal cannula oxygen  Post-op Assessment: Report given to RN and Post -op Vital signs reviewed and stable  Post vital signs: Reviewed and stable  Last Vitals:  Filed Vitals:   01/20/15 1315  BP: 145/104  Pulse: 101  Temp: 36.8 C  Resp: 20    Complications: No apparent anesthesia complications

## 2015-01-21 ENCOUNTER — Encounter (HOSPITAL_COMMUNITY): Payer: Self-pay | Admitting: Orthopaedic Surgery

## 2015-12-09 ENCOUNTER — Emergency Department (HOSPITAL_COMMUNITY): Payer: Self-pay

## 2015-12-09 ENCOUNTER — Encounter (HOSPITAL_COMMUNITY): Payer: Self-pay | Admitting: Emergency Medicine

## 2015-12-09 ENCOUNTER — Emergency Department (HOSPITAL_COMMUNITY)
Admission: EM | Admit: 2015-12-09 | Discharge: 2015-12-10 | Disposition: A | Discharge status: Home / Self Care | Payer: Self-pay | Attending: Emergency Medicine | Admitting: Emergency Medicine

## 2015-12-09 DIAGNOSIS — J449 Chronic obstructive pulmonary disease, unspecified: Secondary | ICD-10-CM | POA: Insufficient documentation

## 2015-12-09 DIAGNOSIS — K8020 Calculus of gallbladder without cholecystitis without obstruction: Secondary | ICD-10-CM | POA: Diagnosis present

## 2015-12-09 DIAGNOSIS — I10 Essential (primary) hypertension: Secondary | ICD-10-CM | POA: Insufficient documentation

## 2015-12-09 DIAGNOSIS — Z7982 Long term (current) use of aspirin: Secondary | ICD-10-CM | POA: Insufficient documentation

## 2015-12-09 DIAGNOSIS — F17210 Nicotine dependence, cigarettes, uncomplicated: Secondary | ICD-10-CM | POA: Insufficient documentation

## 2015-12-09 DIAGNOSIS — K2970 Gastritis, unspecified, without bleeding: Secondary | ICD-10-CM | POA: Insufficient documentation

## 2015-12-09 DIAGNOSIS — Z79899 Other long term (current) drug therapy: Secondary | ICD-10-CM | POA: Insufficient documentation

## 2015-12-09 LAB — URINALYSIS, ROUTINE W REFLEX MICROSCOPIC
Bilirubin Urine: NEGATIVE
Glucose, UA: NEGATIVE mg/dL
HGB URINE DIPSTICK: NEGATIVE
Ketones, ur: 15 mg/dL — AB
LEUKOCYTES UA: NEGATIVE
NITRITE: NEGATIVE
PROTEIN: NEGATIVE mg/dL
SPECIFIC GRAVITY, URINE: 1.025 (ref 1.005–1.030)
pH: 5 (ref 5.0–8.0)

## 2015-12-09 LAB — CBC WITH DIFFERENTIAL/PLATELET
BASOS ABS: 0 10*3/uL (ref 0.0–0.1)
Basophils Relative: 1 %
EOS ABS: 0.2 10*3/uL (ref 0.0–0.7)
EOS PCT: 4 %
HCT: 44.9 % (ref 39.0–52.0)
HEMOGLOBIN: 15.5 g/dL (ref 13.0–17.0)
Lymphocytes Relative: 35 %
Lymphs Abs: 2 10*3/uL (ref 0.7–4.0)
MCH: 33.4 pg (ref 26.0–34.0)
MCHC: 34.5 g/dL (ref 30.0–36.0)
MCV: 96.8 fL (ref 78.0–100.0)
Monocytes Absolute: 0.5 10*3/uL (ref 0.1–1.0)
Monocytes Relative: 9 %
NEUTROS PCT: 51 %
Neutro Abs: 2.9 10*3/uL (ref 1.7–7.7)
PLATELETS: 175 10*3/uL (ref 150–400)
RBC: 4.64 MIL/uL (ref 4.22–5.81)
RDW: 12.7 % (ref 11.5–15.5)
WBC: 5.7 10*3/uL (ref 4.0–10.5)

## 2015-12-09 LAB — LIPASE, BLOOD: LIPASE: 113 U/L — AB (ref 11–51)

## 2015-12-09 LAB — COMPREHENSIVE METABOLIC PANEL
ALT: 82 U/L — AB (ref 17–63)
AST: 94 U/L — AB (ref 15–41)
Albumin: 4 g/dL (ref 3.5–5.0)
Alkaline Phosphatase: 61 U/L (ref 38–126)
Anion gap: 12 (ref 5–15)
BILIRUBIN TOTAL: 0.8 mg/dL (ref 0.3–1.2)
BUN: 7 mg/dL (ref 6–20)
CHLORIDE: 104 mmol/L (ref 101–111)
CO2: 20 mmol/L — ABNORMAL LOW (ref 22–32)
CREATININE: 0.7 mg/dL (ref 0.61–1.24)
Calcium: 9.2 mg/dL (ref 8.9–10.3)
Glucose, Bld: 101 mg/dL — ABNORMAL HIGH (ref 65–99)
POTASSIUM: 3.8 mmol/L (ref 3.5–5.1)
Sodium: 136 mmol/L (ref 135–145)
TOTAL PROTEIN: 7 g/dL (ref 6.5–8.1)

## 2015-12-09 LAB — PROTIME-INR
INR: 0.97
PROTHROMBIN TIME: 12.9 s (ref 11.4–15.2)

## 2015-12-09 MED ORDER — SUCRALFATE 1 G PO TABS
1.0000 g | ORAL_TABLET | Freq: Once | ORAL | Status: AC
  Administered 2015-12-10: 1 g via ORAL
  Filled 2015-12-09: qty 1

## 2015-12-09 MED ORDER — HYDROMORPHONE HCL 1 MG/ML IJ SOLN
1.0000 mg | Freq: Once | INTRAMUSCULAR | Status: AC
  Administered 2015-12-09: 1 mg via INTRAVENOUS
  Filled 2015-12-09: qty 1

## 2015-12-09 MED ORDER — HYDROMORPHONE HCL 1 MG/ML IJ SOLN
1.0000 mg | Freq: Once | INTRAMUSCULAR | Status: AC
  Administered 2015-12-10: 1 mg via INTRAVENOUS
  Filled 2015-12-09: qty 1

## 2015-12-09 MED ORDER — OXYCODONE-ACETAMINOPHEN 5-325 MG PO TABS
1.0000 | ORAL_TABLET | ORAL | Status: DC | PRN
  Administered 2015-12-09: 1 via ORAL
  Filled 2015-12-09: qty 1

## 2015-12-09 MED ORDER — PANTOPRAZOLE SODIUM 40 MG PO TBEC: 40.0000 mg | DELAYED_RELEASE_TABLET | Freq: Every day | ORAL | Status: DC

## 2015-12-09 MED ORDER — PANTOPRAZOLE SODIUM 40 MG PO TBEC
40.0000 mg | DELAYED_RELEASE_TABLET | Freq: Once | ORAL | Status: AC
  Administered 2015-12-10: 40 mg via ORAL
  Filled 2015-12-09: qty 1

## 2015-12-09 MED ORDER — OXYCODONE-ACETAMINOPHEN 5-325 MG PO TABS
ORAL_TABLET | ORAL | Status: AC
  Filled 2015-12-09: qty 1

## 2015-12-09 NOTE — ED Provider Notes (Signed)
Patient seen and evaluated.Discussed with Dr. Cathlean CowerMikell.  He reports pain in his right abdomen for up to 1 month. Worse over last 2-3 days. States that for the last month will cough and vomited in the morning. States he drinks 1 or 2 beers a day. He used to drink heavily. No history of known cirrhosis or liver abnormalities. Urine is been dark yellow but not tea colored. No pale stools. No history of hepatitis. For appetite. He is uncertain if food exacerbates his symptoms.  On exam he has tenderness in the right mid abdomen and into his right upper quadrant and subcostal. Pain with movement. No frank rigidity.  Review of workup including pain control, lab evaluation, CT. Differential diagnosis would include hepatobiliary, cirrhosis, hepatitis, appendicitis, alcoholic gastritis duodenitis.   Joel PorterMark Rebekah Sprinkle, MD 12/09/15 [phone]

## 2015-12-09 NOTE — ED Notes (Signed)
Patient transported to X-ray 
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
## 2015-12-09 NOTE — ED Triage Notes (Signed)
Pt states he has a productive cough- white sputum.

## 2015-12-09 NOTE — ED Provider Notes (Signed)
Patient seen and evaluated. Discussed with Dr.Mikell.  Patient with several days of right mid abdominal pain. Drink daily and heavily. Does smoke. Takes occasional anti-inflammatories. No nausea. Food does tend to make it worse at times, better at other times. No diarrhea. No dark stools. No light stools are dark urine. No history of liver abnormalities or pancreatitis. No past surgical history. No history of biliary tract disease. He has pain with movement. His tenderness mid to upper abdomen. No frank peritonitis or rigidity.  No leukocytosis.  No elevation of biliary enzymes. Lipase 113. CT shows no appendicitis or other intra-abdominal pathology. Ultrasound suggests liver disease and fatty infiltration but no biliary tract disease. Plan be treated with PPI Carafate for probable as related phenomenon. GI referral. Prescription for PPI, Carafate, pain medication. Advise absence from Alcohol, tobacco, caffeine, anti-inflammatories.   Rolland PorterMark Aitan Rossbach, MD 12/09/15 [phone]

## 2015-12-09 NOTE — ED Triage Notes (Signed)
Pt states he is having right flank pain that worsens with movement. This has been going on for 3 days. Denies any injury.

## 2015-12-10 DIAGNOSIS — K8020 Calculus of gallbladder without cholecystitis without obstruction: Secondary | ICD-10-CM | POA: Diagnosis present

## 2015-12-10 LAB — GC/CHLAMYDIA PROBE AMP (~~LOC~~) NOT AT ARMC
CHLAMYDIA, DNA PROBE: NEGATIVE
NEISSERIA GONORRHEA: NEGATIVE

## 2015-12-10 MED ORDER — SUCRALFATE 1 G PO TABS: 1.0000 g | ORAL_TABLET | Freq: Two times a day (BID) | ORAL | 0 refills | Status: DC | PRN

## 2015-12-10 MED ORDER — PANTOPRAZOLE SODIUM 40 MG PO TBEC: 40.0000 mg | DELAYED_RELEASE_TABLET | Freq: Every day | ORAL | 0 refills | Status: DC

## 2015-12-10 MED ORDER — OXYCODONE-ACETAMINOPHEN 5-325 MG PO TABS: 1.0000 | ORAL_TABLET | Freq: Four times a day (QID) | ORAL | 0 refills | Status: DC | PRN

## 2015-12-10 NOTE — Discharge Instructions (Signed)
You likely have gastritis. I want you to take Protonix 40 mg for the next 4 weeks. Please follow up with [facility] for your abdominal pain for further work up. If you have significant worsening of your pain, nausea or vomiting please return to the ED. I have provided percocet for pain control and Carfate as needed for abdominal pan as well.

## 2016-04-21 ENCOUNTER — Emergency Department (HOSPITAL_COMMUNITY)
Admission: EM | Admit: 2016-04-21 | Discharge: 2016-04-21 | Disposition: A | Discharge status: Home / Self Care | Payer: Self-pay | Attending: Emergency Medicine | Admitting: Emergency Medicine

## 2016-04-21 ENCOUNTER — Emergency Department (HOSPITAL_COMMUNITY): Payer: Self-pay

## 2016-04-21 ENCOUNTER — Encounter (HOSPITAL_COMMUNITY): Payer: Self-pay | Admitting: *Deleted

## 2016-04-21 DIAGNOSIS — Z79899 Other long term (current) drug therapy: Secondary | ICD-10-CM | POA: Insufficient documentation

## 2016-04-21 DIAGNOSIS — Z7982 Long term (current) use of aspirin: Secondary | ICD-10-CM | POA: Insufficient documentation

## 2016-04-21 DIAGNOSIS — M170 Bilateral primary osteoarthritis of knee: Secondary | ICD-10-CM | POA: Insufficient documentation

## 2016-04-21 DIAGNOSIS — I10 Essential (primary) hypertension: Secondary | ICD-10-CM | POA: Insufficient documentation

## 2016-04-21 DIAGNOSIS — F17210 Nicotine dependence, cigarettes, uncomplicated: Secondary | ICD-10-CM | POA: Insufficient documentation

## 2016-04-21 DIAGNOSIS — J449 Chronic obstructive pulmonary disease, unspecified: Secondary | ICD-10-CM | POA: Insufficient documentation

## 2016-04-21 MED ORDER — MELOXICAM 7.5 MG PO TABS: 15.0000 mg | ORAL_TABLET | Freq: Every day | ORAL | 0 refills | Status: DC

## 2016-04-21 MED ORDER — NAPROXEN 250 MG PO TABS
500.0000 mg | ORAL_TABLET | Freq: Once | ORAL | Status: AC
  Administered 2016-04-21: 500 mg via ORAL
  Filled 2016-04-21: qty 2

## 2016-04-21 NOTE — Discharge Instructions (Signed)
Take the prescribed medication as directed. Follow-up with Dr. Aundria Rudogers with orthopedics-- call to make appt. Return to the ED for new or worsening symptoms.

## 2016-04-21 NOTE — ED Notes (Signed)
Pt is in stable condition upon d/c and is escorted from ED via wheelchair. 

## 2016-04-21 NOTE — Progress Notes (Signed)
Orthopedic Tech Progress Note Patient Details:  [name] [birth year] [id number]  Ortho Devices Type of Ortho Device: Knee Sleeve Ortho Device/Splint Location: bilateral Ortho Device/Splint Interventions: Application   Zamora Colton 04/21/2016, 2:43 PM

## 2016-04-21 NOTE — ED Triage Notes (Signed)
Pt reports bilateral knee pain, states that "his knees give out on him." denies any injury that caused the pain. Is ambulatory at triage.

## 2016-04-21 NOTE — ED Notes (Signed)
Called Ortho to come and apply bilateral knee sleeves.
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
## 2016-07-10 ENCOUNTER — Emergency Department (HOSPITAL_COMMUNITY): Payer: Self-pay

## 2016-07-10 ENCOUNTER — Emergency Department (HOSPITAL_COMMUNITY)
Admission: EM | Admit: 2016-07-10 | Discharge: 2016-07-10 | Disposition: A | Discharge status: Home / Self Care | Payer: Self-pay | Attending: Emergency Medicine | Admitting: Emergency Medicine

## 2016-07-10 ENCOUNTER — Encounter (HOSPITAL_COMMUNITY): Payer: Self-pay

## 2016-07-10 DIAGNOSIS — F10129 Alcohol abuse with intoxication, unspecified: Secondary | ICD-10-CM | POA: Insufficient documentation

## 2016-07-10 DIAGNOSIS — R079 Chest pain, unspecified: Secondary | ICD-10-CM | POA: Insufficient documentation

## 2016-07-10 LAB — BASIC METABOLIC PANEL
ANION GAP: 12 (ref 5–15)
BUN: 6 mg/dL (ref 6–20)
CO2: 21 mmol/L — ABNORMAL LOW (ref 22–32)
Calcium: 8.5 mg/dL — ABNORMAL LOW (ref 8.9–10.3)
Chloride: 103 mmol/L (ref 101–111)
Creatinine, Ser: 0.6 mg/dL — ABNORMAL LOW (ref 0.61–1.24)
GLUCOSE: 117 mg/dL — AB (ref 65–99)
POTASSIUM: 3.4 mmol/L — AB (ref 3.5–5.1)
Sodium: 136 mmol/L (ref 135–145)

## 2016-07-10 LAB — CBC
HCT: 42.8 % (ref 39.0–52.0)
Hemoglobin: 15.2 g/dL (ref 13.0–17.0)
MCH: 33.8 pg (ref 26.0–34.0)
MCHC: 35.5 g/dL (ref 30.0–36.0)
MCV: 95.1 fL (ref 78.0–100.0)
PLATELETS: 228 10*3/uL (ref 150–400)
RBC: 4.5 MIL/uL (ref 4.22–5.81)
RDW: 12.5 % (ref 11.5–15.5)
WBC: 5.6 10*3/uL (ref 4.0–10.5)

## 2016-07-10 LAB — I-STAT TROPONIN, ED: TROPONIN I, POC: 0 ng/mL (ref 0.00–0.08)

## 2016-07-10 NOTE — ED Triage Notes (Signed)
Pt presents intoxicated with EMS. He called EMS for chest pain. Refused aspirin with EMS and they report that he was not compliant with them. He endorses drinking 4 40s today. A&Ox4, but only short term d/t intoxication. Ambulatory with assistance-not steady.
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
## 2016-07-10 NOTE — ED Notes (Signed)
He announces he is "Leaving because I can't stand it no more--I tried. I need to have a cigarette". He is not angry upon his departure.

## 2016-07-10 NOTE — ED Notes (Signed)
Delay in lab collection due to pt being in X ray

## 2017-01-04 IMAGING — DX DG ANKLE COMPLETE 3+V*L*
3 series · 3 of 3 positions shown · non-contrast
Comparison: None.

CLINICAL DATA: Post fall walking dog yesterday now with pain
involving the entirety of the ankle.

EXAM:
LEFT ANKLE COMPLETE - 3+ VIEW

[x ankle ap left]
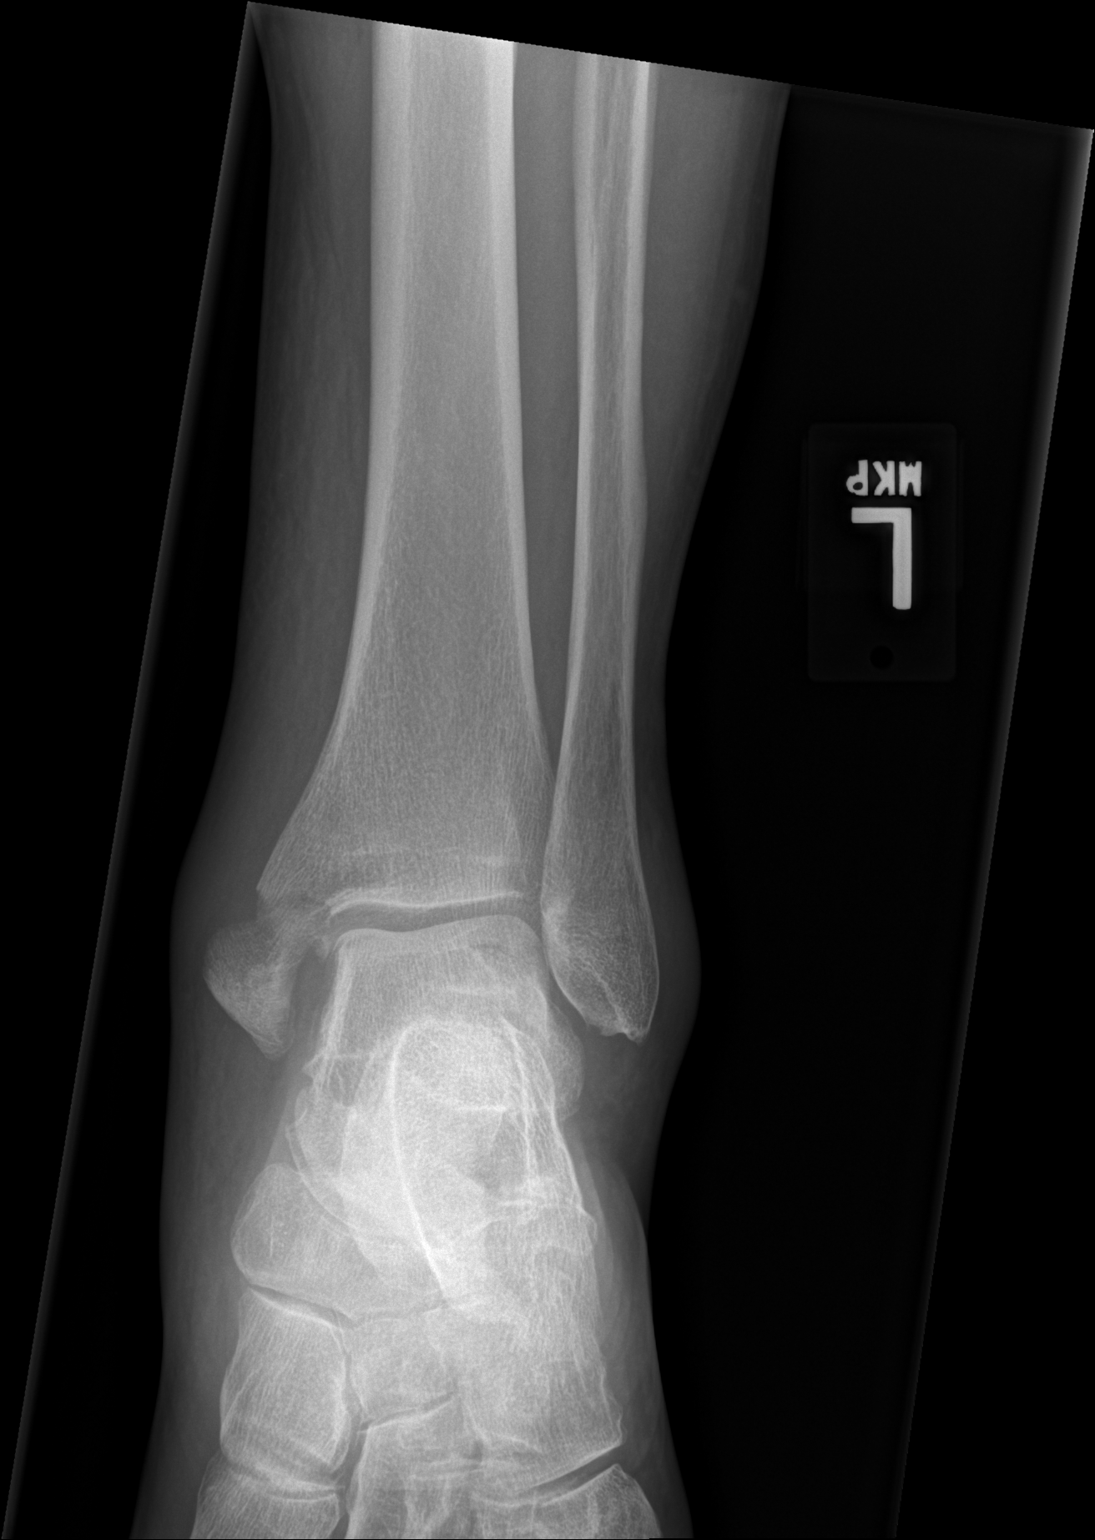

[x ankle obl left]
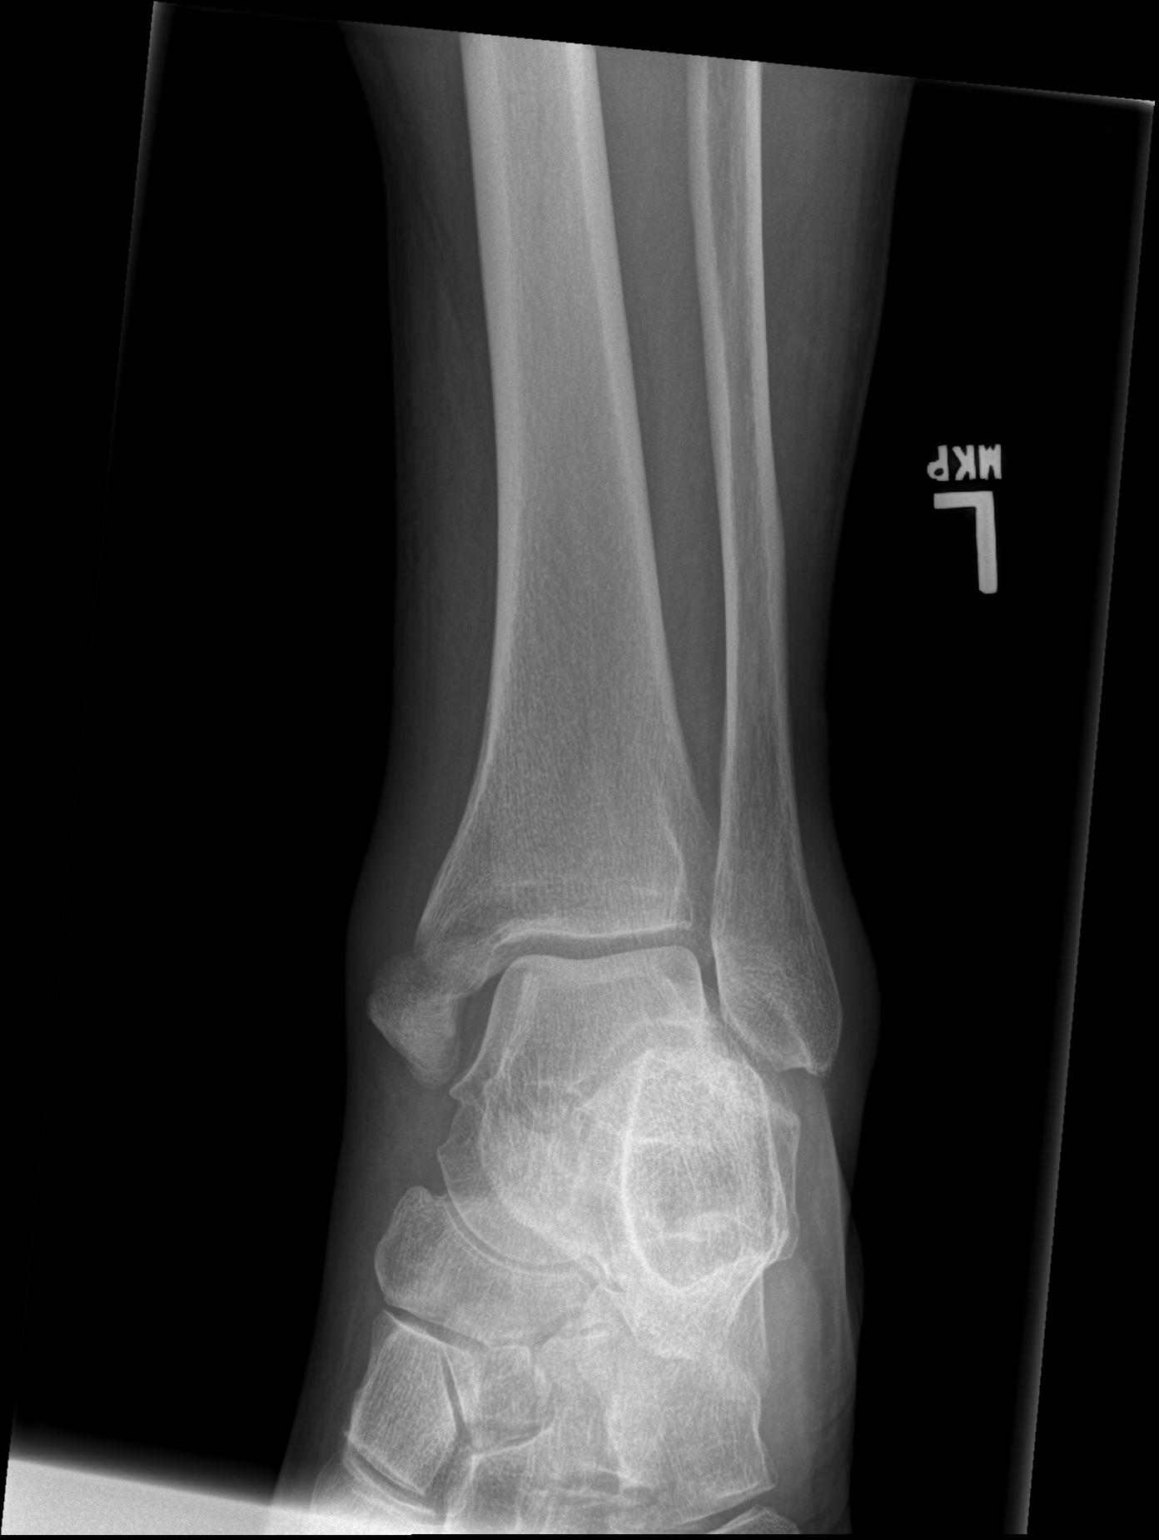

[x ankle lat left]
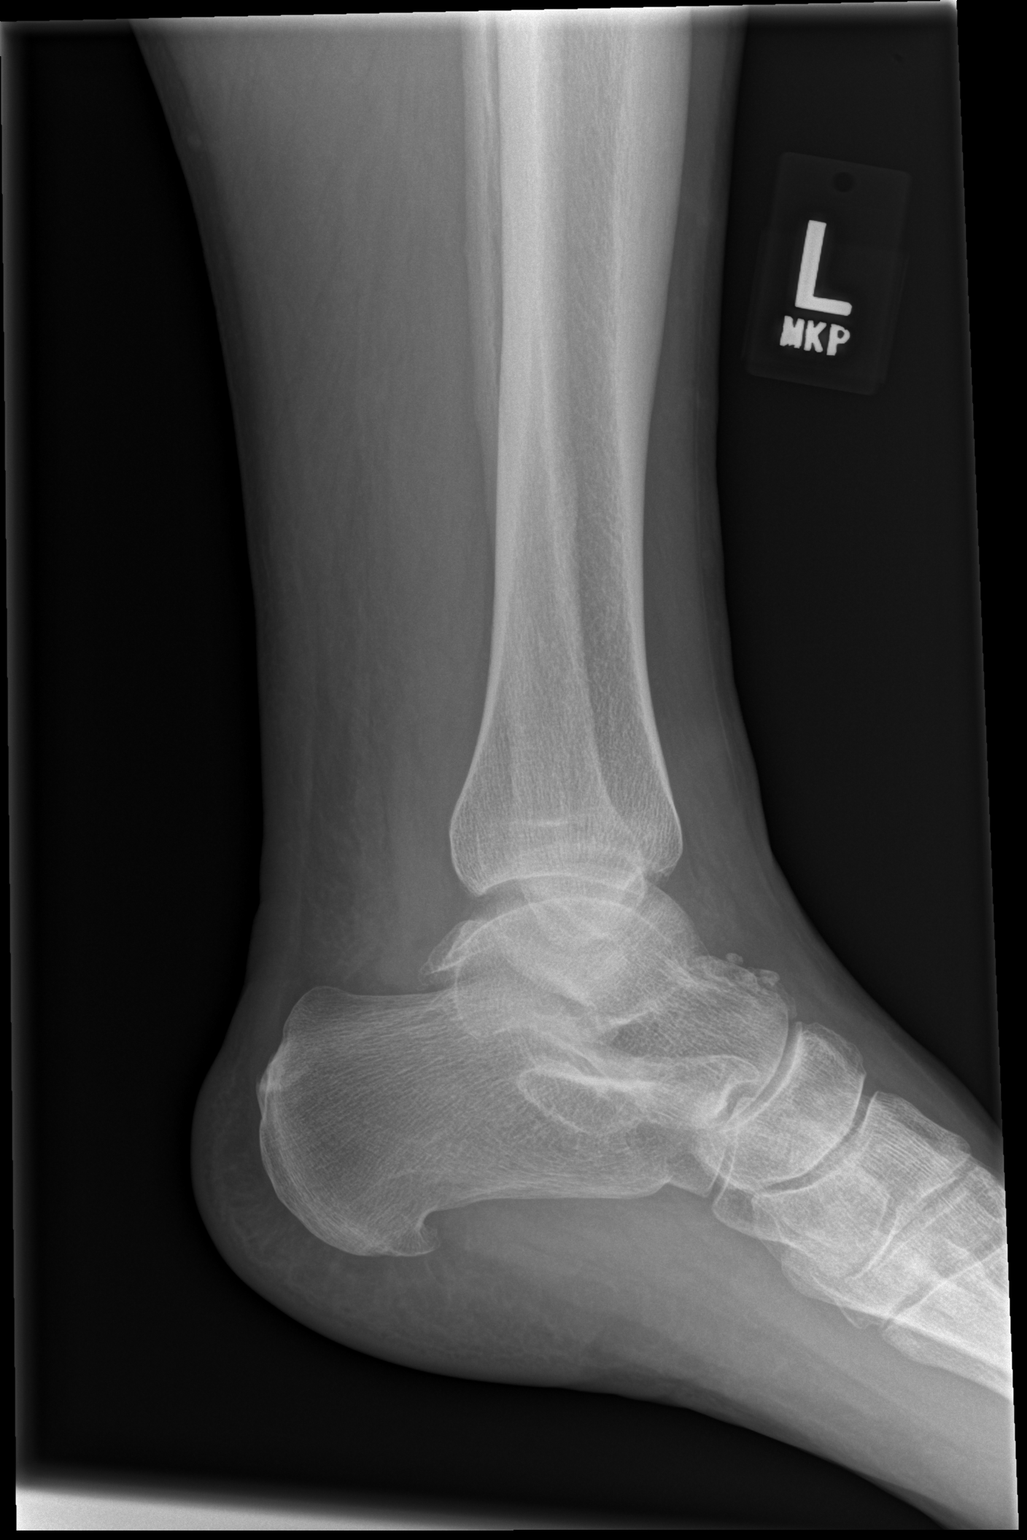

[3 of 3 positions shown; findings below may reference images not displayed]

FINDINGS: There is a horizontally oriented, complete, minimally displaced
fracture involving the medial malleolus with extension to involve
the tibiotalar joint. This finding is without definitive widening of
the ankle mortise. Small ankle joint effusion. Expected adjacent
soft tissue swelling about the entirety of the ankle, most
conspicuous about the medial malleolus. No radiopaque foreign body.
Small plantar calcaneal spur. Note is made of a os tibialis
externum.
IMPRESSION: Complete, minimally displaced fracture of the medial malleolus with
extension to the tibiotalar joint but without definitive widening of
the ankle mortise. As this fracture can be associated with an
additional fracture involving the proximal fibula (Maisonneuve
fracture), further evaluation with left tibia and fibular
radiographs is recommended.

## 2017-01-04 IMAGING — CR DG TIBIA/FIBULA 2V*L*
3 series · 3 of 3 positions shown · non-contrast
Comparison: Left ankle same day

CLINICAL DATA: Fracture

EXAM:
LEFT TIBIA AND FIBULA - 2 VIEW

[tibia ap (1 of 2)]
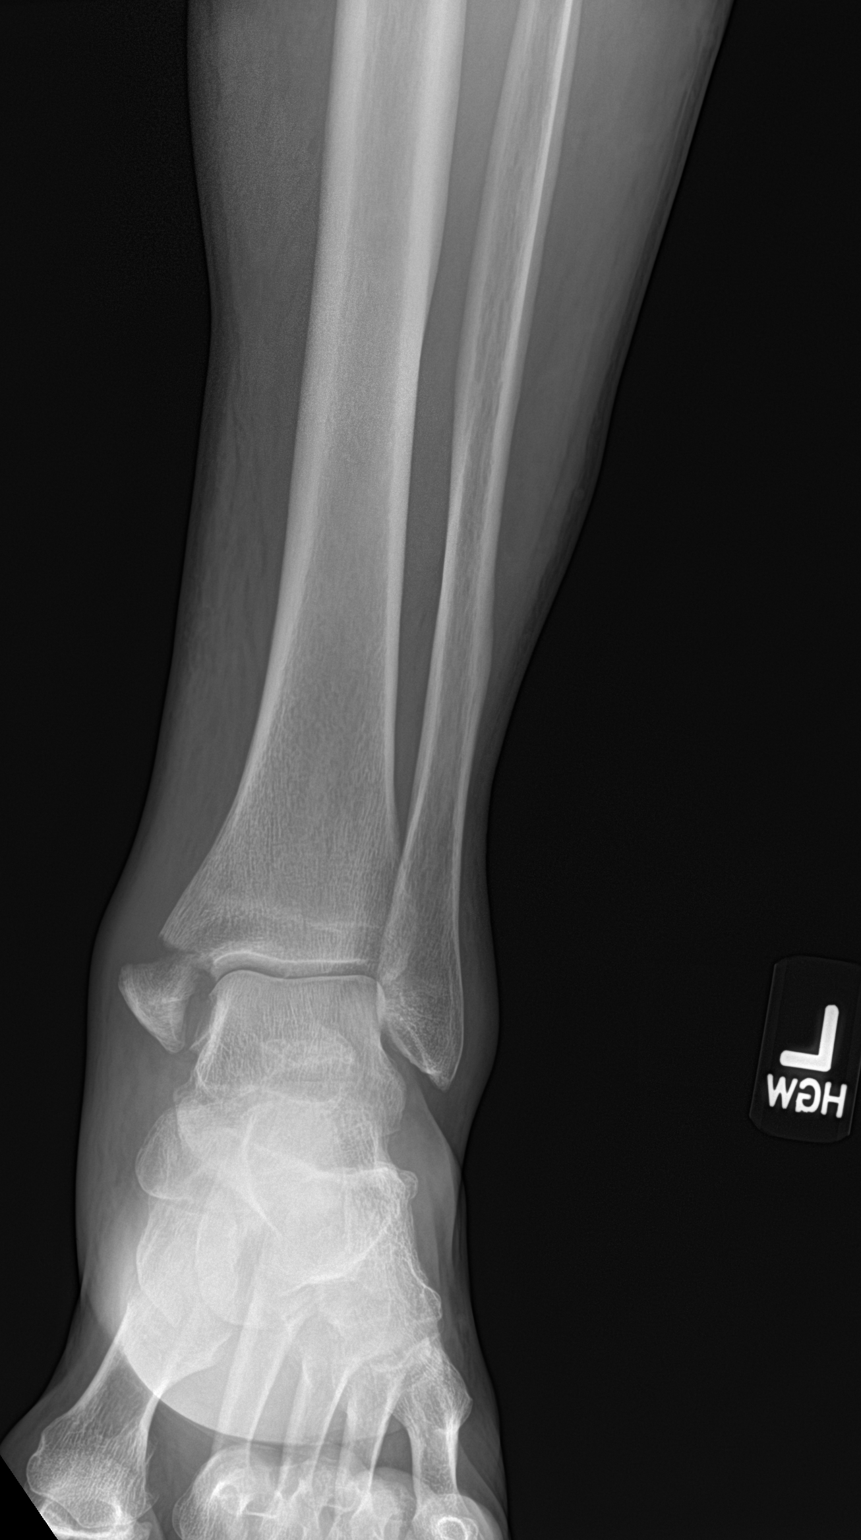

[tibia ap (2 of 2)]
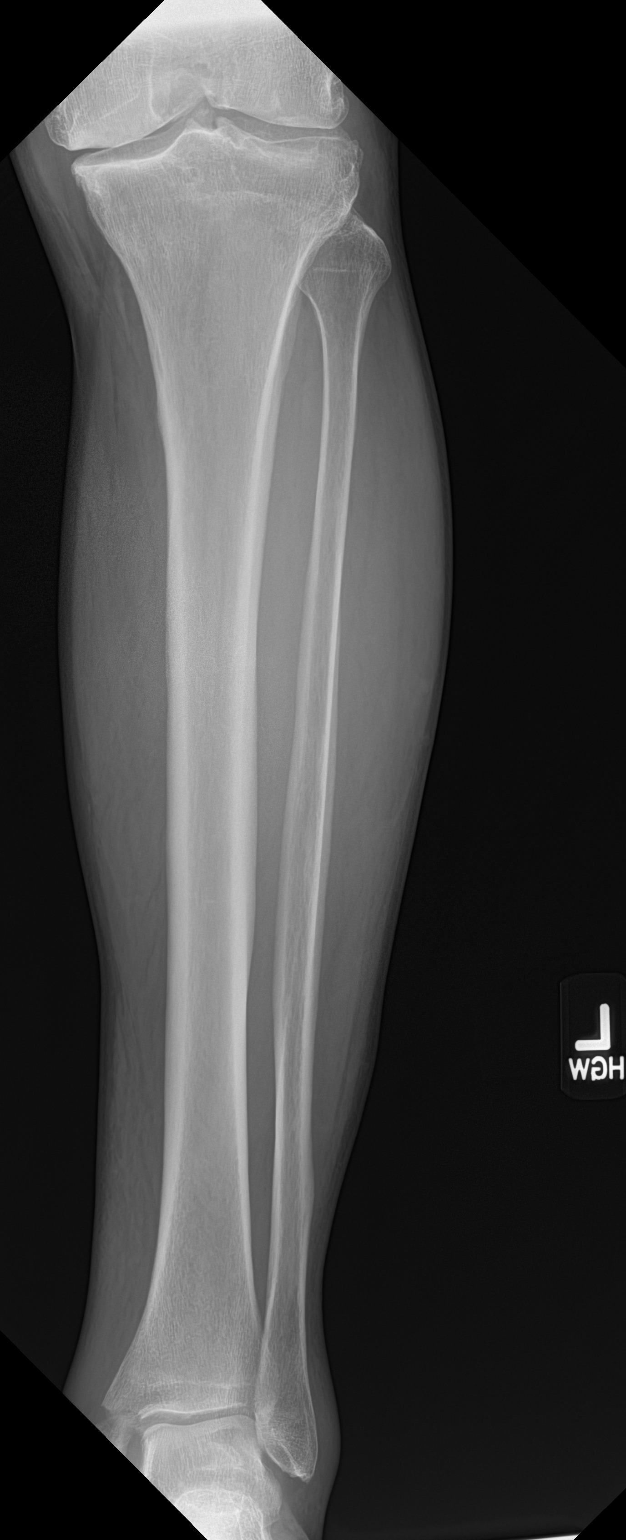

[tibia lat]
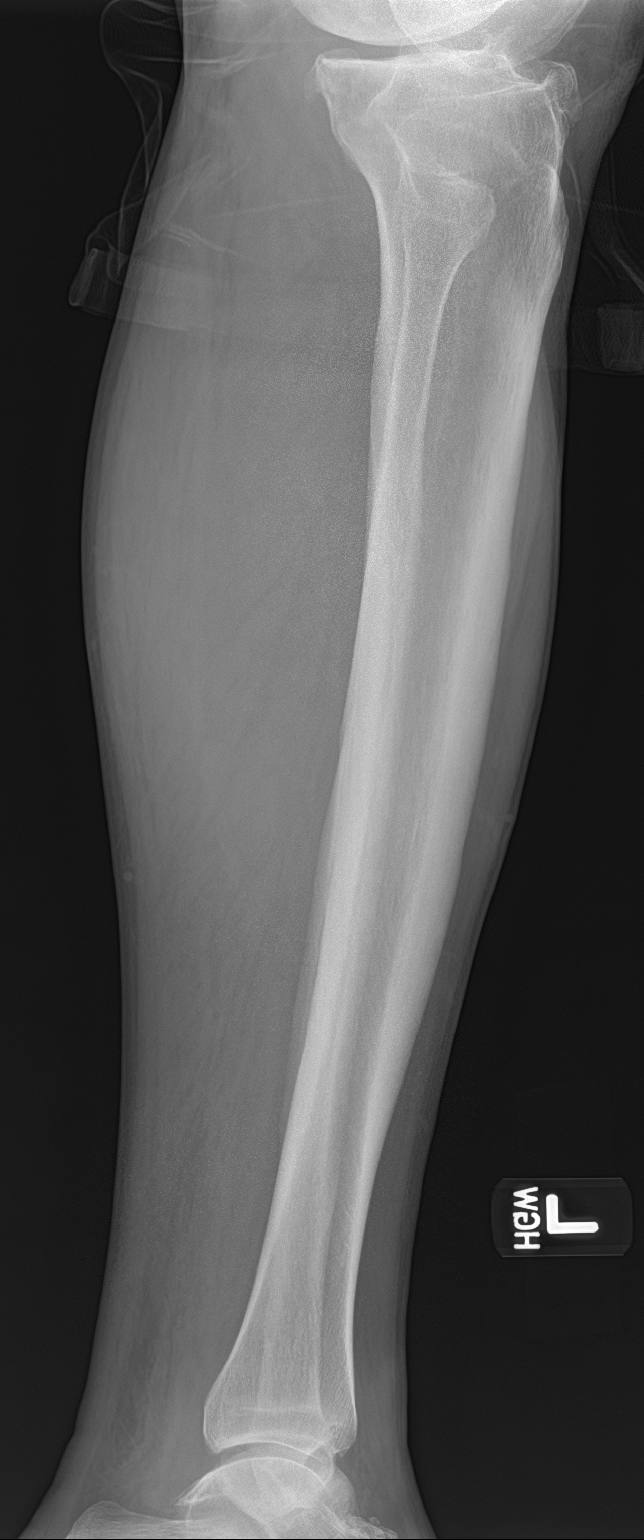

[3 of 3 positions shown; findings below may reference images not displayed]

FINDINGS: Three views of the left tibia fibula submitted. Again noted mild
displaced fracture of distal tibia medial malleolus. No other
fractures are identified. Knee joint is unremarkable.
IMPRESSION: Again noted mild displaced fracture distal tibia medial malleolus.

## 2018-08-30 ENCOUNTER — Emergency Department (HOSPITAL_COMMUNITY)
Admission: EM | Admit: 2018-08-30 | Discharge: 2018-08-30 | Discharge status: Against Medical Advice | Payer: Self-pay | Attending: Emergency Medicine | Admitting: Emergency Medicine

## 2018-08-30 ENCOUNTER — Encounter (HOSPITAL_COMMUNITY): Payer: Self-pay | Admitting: Emergency Medicine

## 2018-08-30 ENCOUNTER — Emergency Department (HOSPITAL_COMMUNITY): Payer: Self-pay

## 2018-08-30 ENCOUNTER — Other Ambulatory Visit: Payer: Self-pay

## 2018-08-30 DIAGNOSIS — Z5321 Procedure and treatment not carried out due to patient leaving prior to being seen by health care provider: Secondary | ICD-10-CM | POA: Insufficient documentation

## 2018-08-30 LAB — CBC
HCT: 44 % (ref 39.0–52.0)
Hemoglobin: 15.2 g/dL (ref 13.0–17.0)
MCH: 33.6 pg (ref 26.0–34.0)
MCHC: 34.5 g/dL (ref 30.0–36.0)
MCV: 97.1 fL (ref 80.0–100.0)
Platelets: 251 10*3/uL (ref 150–400)
RBC: 4.53 MIL/uL (ref 4.22–5.81)
RDW: 12.2 % (ref 11.5–15.5)
WBC: 5.2 10*3/uL (ref 4.0–10.5)
nRBC: 0 % (ref 0.0–0.2)

## 2018-08-30 LAB — BASIC METABOLIC PANEL
Anion gap: 15 (ref 5–15)
BUN: <5 mg/dL — ABNORMAL LOW (ref 6–20)
CO2: 21 mmol/L — ABNORMAL LOW (ref 22–32)
Calcium: 8.7 mg/dL — ABNORMAL LOW (ref 8.9–10.3)
Chloride: 102 mmol/L (ref 98–111)
Creatinine, Ser: 0.75 mg/dL (ref 0.61–1.24)
GFR calc Af Amer: >60 mL/min (ref >60)
GFR calc non Af Amer: >60 mL/min (ref >60)
Glucose, Bld: 134 mg/dL — ABNORMAL HIGH (ref 70–99)
Potassium: 3.8 mmol/L (ref 3.5–5.1)
Sodium: 138 mmol/L (ref 135–145)

## 2018-08-30 MED ORDER — SODIUM CHLORIDE 0.9% FLUSH: 3.0000 mL | Freq: Once | INTRAVENOUS | Status: DC

## 2018-08-30 NOTE — ED Triage Notes (Signed)
Pt c/o generalized weakness and cough x 3 days. Reports fevers at home, afebrile at this time.

## 2018-08-30 NOTE — ED Notes (Signed)
No answer in waiting room 

## 2018-09-23 ENCOUNTER — Emergency Department (HOSPITAL_COMMUNITY): Payer: Self-pay

## 2018-09-23 ENCOUNTER — Emergency Department (HOSPITAL_COMMUNITY)
Admission: EM | Admit: 2018-09-23 | Discharge: 2018-09-23 | Disposition: A | Discharge status: Home / Self Care | Payer: Self-pay | Attending: Emergency Medicine | Admitting: Emergency Medicine

## 2018-09-23 ENCOUNTER — Other Ambulatory Visit: Payer: Self-pay

## 2018-09-23 DIAGNOSIS — J069 Acute upper respiratory infection, unspecified: Secondary | ICD-10-CM | POA: Insufficient documentation

## 2018-09-23 DIAGNOSIS — R52 Pain, unspecified: Secondary | ICD-10-CM

## 2018-09-23 DIAGNOSIS — I10 Essential (primary) hypertension: Secondary | ICD-10-CM | POA: Insufficient documentation

## 2018-09-23 DIAGNOSIS — J449 Chronic obstructive pulmonary disease, unspecified: Secondary | ICD-10-CM | POA: Insufficient documentation

## 2018-09-23 DIAGNOSIS — Z20828 Contact with and (suspected) exposure to other viral communicable diseases: Secondary | ICD-10-CM | POA: Insufficient documentation

## 2018-09-23 DIAGNOSIS — R05 Cough: Secondary | ICD-10-CM | POA: Insufficient documentation

## 2018-09-23 DIAGNOSIS — Z79899 Other long term (current) drug therapy: Secondary | ICD-10-CM | POA: Insufficient documentation

## 2018-09-23 DIAGNOSIS — F17210 Nicotine dependence, cigarettes, uncomplicated: Secondary | ICD-10-CM | POA: Insufficient documentation

## 2018-09-23 LAB — CBC WITH DIFFERENTIAL/PLATELET
Abs Immature Granulocytes: 0.04 10*3/uL (ref 0.00–0.07)
Basophils Absolute: 0.1 10*3/uL (ref 0.0–0.1)
Basophils Relative: 1 %
Eosinophils Absolute: 0.1 10*3/uL (ref 0.0–0.5)
Eosinophils Relative: 2 %
HCT: 43.4 % (ref 39.0–52.0)
Hemoglobin: 14.5 g/dL (ref 13.0–17.0)
Immature Granulocytes: 1 %
Lymphocytes Relative: 24 %
Lymphs Abs: 1.3 10*3/uL (ref 0.7–4.0)
MCH: 33.3 pg (ref 26.0–34.0)
MCHC: 33.4 g/dL (ref 30.0–36.0)
MCV: 99.8 fL (ref 80.0–100.0)
Monocytes Absolute: 0.6 10*3/uL (ref 0.1–1.0)
Monocytes Relative: 11 %
Neutro Abs: 3.5 10*3/uL (ref 1.7–7.7)
Neutrophils Relative %: 61 %
Platelets: 235 10*3/uL (ref 150–400)
RBC: 4.35 MIL/uL (ref 4.22–5.81)
RDW: 12.5 % (ref 11.5–15.5)
WBC: 5.7 10*3/uL (ref 4.0–10.5)
nRBC: 0 % (ref 0.0–0.2)

## 2018-09-23 LAB — BASIC METABOLIC PANEL
Anion gap: 11 (ref 5–15)
BUN: <5 mg/dL — ABNORMAL LOW (ref 6–20)
CO2: 20 mmol/L — ABNORMAL LOW (ref 22–32)
Calcium: 8.3 mg/dL — ABNORMAL LOW (ref 8.9–10.3)
Chloride: 103 mmol/L (ref 98–111)
Creatinine, Ser: 0.68 mg/dL (ref 0.61–1.24)
GFR calc Af Amer: >60 mL/min (ref >60)
GFR calc non Af Amer: >60 mL/min (ref >60)
Glucose, Bld: 122 mg/dL — ABNORMAL HIGH (ref 70–99)
Potassium: 4.1 mmol/L (ref 3.5–5.1)
Sodium: 134 mmol/L — ABNORMAL LOW (ref 135–145)

## 2018-09-23 LAB — SARS CORONAVIRUS 2 BY RT PCR (HOSPITAL ORDER, PERFORMED IN ~~LOC~~ HOSPITAL LAB): SARS Coronavirus 2: NEGATIVE

## 2018-09-23 MED ORDER — ALBUTEROL SULFATE HFA 108 (90 BASE) MCG/ACT IN AERS
2.0000 | INHALATION_SPRAY | Freq: Once | RESPIRATORY_TRACT | Status: AC
  Administered 2018-09-23: 2 via RESPIRATORY_TRACT
  Filled 2018-09-23: qty 6.7

## 2018-09-23 MED ORDER — PREDNISONE 20 MG PO TABS
60.0000 mg | ORAL_TABLET | Freq: Once | ORAL | Status: AC
  Administered 2018-09-23: 21:00:00 60 mg via ORAL
  Filled 2018-09-23: qty 3

## 2018-09-23 MED ORDER — AEROCHAMBER PLUS FLO-VU LARGE MISC
Status: AC
  Filled 2018-09-23: qty 1

## 2018-09-23 MED ORDER — AEROCHAMBER PLUS FLO-VU LARGE MISC
1.0000 | Freq: Once | Status: AC
  Administered 2018-09-23: 1

## 2018-09-23 NOTE — ED Notes (Signed)
Patient belongings were removed and placed in bags. Multiple bedbugs and body lice were noted to be on patient and clothing. Specimens were collected. Patient was rinsed off and bathed in [place] room. Patient was upset and stated he was "being treated like an animal". Patient assured that decon was vital for his own health as well as the health of other patients and employees.

## 2018-09-23 NOTE — Discharge Instructions (Signed)
Please read instructions below.  Your chest xray and labs are reassuring. You COVID test is negative. You can take tylenol or ibuprofen as needed for sore throat or fever.  Drink plenty of water.  Use the albuterol every 6 hours as needed for wheezing. Follow up with your primary care provider. Return to the ER for inability to swallow liquids, difficulty breathing, or new or worsening symptoms.

## 2018-09-23 NOTE — ED Notes (Signed)
Patient Alert and oriented to baseline. Stable and ambulatory to baseline. Patient verbalized understanding of the discharge instructions.  Patient belongings were taken by the patient.   
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
## 2018-09-25 ENCOUNTER — Other Ambulatory Visit: Payer: Self-pay

## 2018-09-25 ENCOUNTER — Emergency Department (HOSPITAL_COMMUNITY): Payer: Self-pay

## 2018-09-25 ENCOUNTER — Encounter (HOSPITAL_COMMUNITY): Payer: Self-pay

## 2018-09-25 ENCOUNTER — Observation Stay (HOSPITAL_COMMUNITY)
Admission: EM | Admit: 2018-09-25 | Discharge: 2018-09-26 | Disposition: A | Discharge status: Home / Self Care | Payer: Self-pay | Attending: Physician Assistant | Admitting: Physician Assistant

## 2018-09-25 DIAGNOSIS — B852 Pediculosis, unspecified: Secondary | ICD-10-CM | POA: Insufficient documentation

## 2018-09-25 DIAGNOSIS — E871 Hypo-osmolality and hyponatremia: Secondary | ICD-10-CM | POA: Insufficient documentation

## 2018-09-25 DIAGNOSIS — S2242XA Multiple fractures of ribs, left side, initial encounter for closed fracture: Principal | ICD-10-CM | POA: Insufficient documentation

## 2018-09-25 DIAGNOSIS — Z1159 Encounter for screening for other viral diseases: Secondary | ICD-10-CM | POA: Insufficient documentation

## 2018-09-25 DIAGNOSIS — F1010 Alcohol abuse, uncomplicated: Secondary | ICD-10-CM | POA: Insufficient documentation

## 2018-09-25 DIAGNOSIS — Z8249 Family history of ischemic heart disease and other diseases of the circulatory system: Secondary | ICD-10-CM | POA: Insufficient documentation

## 2018-09-25 DIAGNOSIS — B888 Other specified infestations: Secondary | ICD-10-CM | POA: Insufficient documentation

## 2018-09-25 DIAGNOSIS — Z79899 Other long term (current) drug therapy: Secondary | ICD-10-CM | POA: Insufficient documentation

## 2018-09-25 DIAGNOSIS — F329 Major depressive disorder, single episode, unspecified: Secondary | ICD-10-CM | POA: Insufficient documentation

## 2018-09-25 DIAGNOSIS — F17210 Nicotine dependence, cigarettes, uncomplicated: Secondary | ICD-10-CM | POA: Insufficient documentation

## 2018-09-25 DIAGNOSIS — F419 Anxiety disorder, unspecified: Secondary | ICD-10-CM | POA: Insufficient documentation

## 2018-09-25 DIAGNOSIS — I10 Essential (primary) hypertension: Secondary | ICD-10-CM | POA: Insufficient documentation

## 2018-09-25 DIAGNOSIS — J441 Chronic obstructive pulmonary disease with (acute) exacerbation: Secondary | ICD-10-CM | POA: Insufficient documentation

## 2018-09-25 DIAGNOSIS — S2249XA Multiple fractures of ribs, unspecified side, initial encounter for closed fracture: Secondary | ICD-10-CM | POA: Diagnosis present

## 2018-09-25 DIAGNOSIS — Z881 Allergy status to other antibiotic agents status: Secondary | ICD-10-CM | POA: Insufficient documentation

## 2018-09-25 LAB — CBC WITH DIFFERENTIAL/PLATELET
Abs Immature Granulocytes: 0.03 10*3/uL (ref 0.00–0.07)
Basophils Absolute: 0.1 10*3/uL (ref 0.0–0.1)
Basophils Relative: 1 %
Eosinophils Absolute: 0.1 10*3/uL (ref 0.0–0.5)
Eosinophils Relative: 1 %
HCT: 43.4 % (ref 39.0–52.0)
Hemoglobin: 14.9 g/dL (ref 13.0–17.0)
Immature Granulocytes: 0 %
Lymphocytes Relative: 16 %
Lymphs Abs: 1.1 10*3/uL (ref 0.7–4.0)
MCH: 33.5 pg (ref 26.0–34.0)
MCHC: 34.3 g/dL (ref 30.0–36.0)
MCV: 97.5 fL (ref 80.0–100.0)
Monocytes Absolute: 0.7 10*3/uL (ref 0.1–1.0)
Monocytes Relative: 10 %
Neutro Abs: 5.1 10*3/uL (ref 1.7–7.7)
Neutrophils Relative %: 72 %
Platelets: 210 10*3/uL (ref 150–400)
RBC: 4.45 MIL/uL (ref 4.22–5.81)
RDW: 12.6 % (ref 11.5–15.5)
WBC: 7 10*3/uL (ref 4.0–10.5)
nRBC: 0 % (ref 0.0–0.2)

## 2018-09-25 LAB — URINALYSIS, ROUTINE W REFLEX MICROSCOPIC
Bilirubin Urine: NEGATIVE
Glucose, UA: NEGATIVE mg/dL
Hgb urine dipstick: NEGATIVE
Ketones, ur: NEGATIVE mg/dL
Leukocytes,Ua: NEGATIVE
Nitrite: NEGATIVE
Protein, ur: NEGATIVE mg/dL
Specific Gravity, Urine: 1.035 — ABNORMAL HIGH (ref 1.005–1.030)
pH: 6 (ref 5.0–8.0)

## 2018-09-25 LAB — BASIC METABOLIC PANEL
Anion gap: 10 (ref 5–15)
BUN: 6 mg/dL (ref 6–20)
CO2: 20 mmol/L — ABNORMAL LOW (ref 22–32)
Calcium: 9.2 mg/dL (ref 8.9–10.3)
Chloride: 99 mmol/L (ref 98–111)
Creatinine, Ser: 0.64 mg/dL (ref 0.61–1.24)
GFR calc Af Amer: >60 mL/min (ref >60)
GFR calc non Af Amer: >60 mL/min (ref >60)
Glucose, Bld: 119 mg/dL — ABNORMAL HIGH (ref 70–99)
Potassium: 4.2 mmol/L (ref 3.5–5.1)
Sodium: 129 mmol/L — ABNORMAL LOW (ref 135–145)

## 2018-09-25 LAB — CK: Total CK: 214 U/L (ref 49–397)

## 2018-09-25 LAB — SARS CORONAVIRUS 2 BY RT PCR (HOSPITAL ORDER, PERFORMED IN ~~LOC~~ HOSPITAL LAB): SARS Coronavirus 2: NEGATIVE

## 2018-09-25 MED ORDER — PANTOPRAZOLE SODIUM 40 MG IV SOLR: 40.0000 mg | Freq: Every day | INTRAVENOUS | Status: DC

## 2018-09-25 MED ORDER — IOHEXOL 300 MG/ML  SOLN
75.0000 mL | Freq: Once | INTRAMUSCULAR | Status: AC | PRN
  Administered 2018-09-25: 75 mL via INTRAVENOUS

## 2018-09-25 MED ORDER — ACETAMINOPHEN 325 MG PO TABS
650.0000 mg | ORAL_TABLET | Freq: Four times a day (QID) | ORAL | Status: DC
  Administered 2018-09-25 – 2018-09-26 (×2): 650 mg via ORAL
  Filled 2018-09-25 (×2): qty 2

## 2018-09-25 MED ORDER — ALBUTEROL SULFATE HFA 108 (90 BASE) MCG/ACT IN AERS
2.0000 | INHALATION_SPRAY | Freq: Four times a day (QID) | RESPIRATORY_TRACT | Status: DC
  Filled 2018-09-25: qty 6.7

## 2018-09-25 MED ORDER — OXYCODONE HCL 5 MG PO TABS
10.0000 mg | ORAL_TABLET | ORAL | Status: DC | PRN
  Administered 2018-09-25 – 2018-09-26 (×2): 10 mg via ORAL
  Filled 2018-09-25 (×2): qty 2

## 2018-09-25 MED ORDER — LORAZEPAM 1 MG PO TABS: 1.0000 mg | ORAL_TABLET | Freq: Four times a day (QID) | ORAL | Status: DC | PRN

## 2018-09-25 MED ORDER — FOLIC ACID 1 MG PO TABS: 1.0000 mg | ORAL_TABLET | Freq: Every day | ORAL | Status: DC

## 2018-09-25 MED ORDER — IOHEXOL 300 MG/ML  SOLN: 100.0000 mL | Freq: Once | INTRAMUSCULAR | Status: DC | PRN

## 2018-09-25 MED ORDER — OXYCODONE HCL 5 MG PO TABS
5.0000 mg | ORAL_TABLET | ORAL | Status: DC | PRN
  Administered 2018-09-26: 08:00:00 5 mg via ORAL
  Filled 2018-09-25: qty 1

## 2018-09-25 MED ORDER — ONDANSETRON 4 MG PO TBDP: 4.0000 mg | ORAL_TABLET | Freq: Four times a day (QID) | ORAL | Status: DC | PRN

## 2018-09-25 MED ORDER — LORAZEPAM 2 MG/ML IJ SOLN: 1.0000 mg | Freq: Four times a day (QID) | INTRAMUSCULAR | Status: DC | PRN

## 2018-09-25 MED ORDER — METHOCARBAMOL 500 MG PO TABS
500.0000 mg | ORAL_TABLET | Freq: Three times a day (TID) | ORAL | Status: DC
  Administered 2018-09-25: 500 mg via ORAL
  Filled 2018-09-25: qty 1

## 2018-09-25 MED ORDER — ONDANSETRON HCL 4 MG/2ML IJ SOLN: 4.0000 mg | Freq: Four times a day (QID) | INTRAMUSCULAR | Status: DC | PRN

## 2018-09-25 MED ORDER — THIAMINE HCL 100 MG/ML IJ SOLN: 100.0000 mg | Freq: Every day | INTRAMUSCULAR | Status: DC

## 2018-09-25 MED ORDER — SODIUM CHLORIDE 0.9 % IV BOLUS
500.0000 mL | Freq: Once | INTRAVENOUS | Status: AC
  Administered 2018-09-25: 500 mL via INTRAVENOUS

## 2018-09-25 MED ORDER — PANTOPRAZOLE SODIUM 40 MG PO TBEC: 40.0000 mg | DELAYED_RELEASE_TABLET | Freq: Every day | ORAL | Status: DC

## 2018-09-25 MED ORDER — MORPHINE SULFATE (PF) 2 MG/ML IV SOLN: 1.0000 mg | INTRAVENOUS | Status: DC | PRN

## 2018-09-25 MED ORDER — MORPHINE SULFATE (PF) 4 MG/ML IV SOLN
4.0000 mg | Freq: Once | INTRAVENOUS | Status: AC
  Administered 2018-09-25: 19:00:00 4 mg via INTRAVENOUS
  Filled 2018-09-25: qty 1

## 2018-09-25 MED ORDER — HYDROMORPHONE HCL 1 MG/ML IJ SOLN
1.0000 mg | Freq: Once | INTRAMUSCULAR | Status: AC
  Administered 2018-09-25: 1 mg via INTRAVENOUS
  Filled 2018-09-25: qty 1

## 2018-09-25 MED ORDER — POTASSIUM CHLORIDE IN NACL 20-0.9 MEQ/L-% IV SOLN
INTRAVENOUS | Status: DC
  Administered 2018-09-25: 23:00:00 via INTRAVENOUS
  Filled 2018-09-25: qty 1000

## 2018-09-25 MED ORDER — ADULT MULTIVITAMIN W/MINERALS CH: 1.0000 | ORAL_TABLET | Freq: Every day | ORAL | Status: DC

## 2018-09-25 MED ORDER — ENOXAPARIN SODIUM 40 MG/0.4ML ~~LOC~~ SOLN
40.0000 mg | SUBCUTANEOUS | Status: DC
  Administered 2018-09-26: 40 mg via SUBCUTANEOUS
  Filled 2018-09-25: qty 0.4

## 2018-09-25 MED ORDER — DOCUSATE SODIUM 100 MG PO CAPS
100.0000 mg | ORAL_CAPSULE | Freq: Two times a day (BID) | ORAL | Status: DC
  Administered 2018-09-25: 100 mg via ORAL
  Filled 2018-09-25: qty 1

## 2018-09-25 MED ORDER — DIPHENHYDRAMINE HCL 50 MG/ML IJ SOLN: 12.5000 mg | Freq: Three times a day (TID) | INTRAMUSCULAR | Status: DC | PRN

## 2018-09-25 MED ORDER — VITAMIN B-1 100 MG PO TABS: 100.0000 mg | ORAL_TABLET | Freq: Every day | ORAL | Status: DC
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
## 2018-09-25 NOTE — ED Notes (Signed)
ED Provider at bedside. 

## 2018-09-25 NOTE — ED Notes (Signed)
Lab notified to add on CK to labs
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
## 2018-09-25 NOTE — ED Notes (Signed)
Rhoncous cough noted with weak effort for cough. Pt state pain with cough.
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
## 2018-09-25 NOTE — ED Notes (Signed)
Pt moves to chair and is placed onn cardiac monitor.

## 2018-09-26 ENCOUNTER — Encounter (HOSPITAL_COMMUNITY): Payer: Self-pay | Admitting: *Deleted

## 2018-09-26 LAB — CBC
HCT: 38.9 % — ABNORMAL LOW (ref 39.0–52.0)
Hemoglobin: 13.1 g/dL (ref 13.0–17.0)
MCH: 32.7 pg (ref 26.0–34.0)
MCHC: 33.7 g/dL (ref 30.0–36.0)
MCV: 97 fL (ref 80.0–100.0)
Platelets: 201 10*3/uL (ref 150–400)
RBC: 4.01 MIL/uL — ABNORMAL LOW (ref 4.22–5.81)
RDW: 12.6 % (ref 11.5–15.5)
WBC: 6.2 10*3/uL (ref 4.0–10.5)
nRBC: 0 % (ref 0.0–0.2)

## 2018-09-26 LAB — BASIC METABOLIC PANEL
Anion gap: 8 (ref 5–15)
BUN: 5 mg/dL — ABNORMAL LOW (ref 6–20)
CO2: 25 mmol/L (ref 22–32)
Calcium: 8.8 mg/dL — ABNORMAL LOW (ref 8.9–10.3)
Chloride: 102 mmol/L (ref 98–111)
Creatinine, Ser: 0.64 mg/dL (ref 0.61–1.24)
GFR calc Af Amer: >60 mL/min (ref >60)
GFR calc non Af Amer: >60 mL/min (ref >60)
Glucose, Bld: 111 mg/dL — ABNORMAL HIGH (ref 70–99)
Potassium: 3.8 mmol/L (ref 3.5–5.1)
Sodium: 135 mmol/L (ref 135–145)

## 2018-09-26 LAB — HIV ANTIBODY (ROUTINE TESTING W REFLEX): HIV Screen 4th Generation wRfx: NONREACTIVE

## 2018-09-26 MED ORDER — ALBUTEROL SULFATE (2.5 MG/3ML) 0.083% IN NEBU
2.5000 mg | INHALATION_SOLUTION | Freq: Four times a day (QID) | RESPIRATORY_TRACT | Status: DC
  Filled 2018-09-26: qty 3

## 2018-09-26 MED ORDER — ALBUTEROL SULFATE HFA 108 (90 BASE) MCG/ACT IN AERS: 2.0000 | INHALATION_SPRAY | Freq: Four times a day (QID) | RESPIRATORY_TRACT | Status: DC

## 2018-09-26 MED ORDER — OXYCODONE HCL 5 MG PO TABS: 5.0000 mg | ORAL_TABLET | Freq: Four times a day (QID) | ORAL | 0 refills | Status: DC | PRN

## 2018-09-26 MED ORDER — METHOCARBAMOL 500 MG PO TABS: 500.0000 mg | ORAL_TABLET | Freq: Four times a day (QID) | ORAL | 0 refills | Status: AC | PRN

## 2018-09-26 MED ORDER — NICOTINE 21 MG/24HR TD PT24: 21.0000 mg | MEDICATED_PATCH | Freq: Every day | TRANSDERMAL | Status: DC

## 2018-09-26 MED ORDER — ADULT MULTIVITAMIN W/MINERALS CH: 1.0000 | ORAL_TABLET | Freq: Every day | ORAL | Status: AC

## 2018-09-26 NOTE — Progress Notes (Signed)
Discharged home today, his brother is driving him home. Discharged instructions, personal belongings given. Advised to pick medications from [facility] of choice. Verbalized understanding of the instructions
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
## 2018-09-26 NOTE — Plan of Care (Signed)
  Problem: Education: Goal: Knowledge of General Education information will improve Description Including pain rating scale, medication(s)/side effects and non-pharmacologic comfort measures Outcome: Progressing   

## 2018-09-26 NOTE — Discharge Instructions (Signed)
°Rib Fracture ° °A rib fracture is a break or crack in one of the bones of the ribs. The ribs are long, curved bones that wrap around your chest and attach to your spine and your breastbone. The ribs protect your heart, lungs, and other organs in the chest. °A broken or cracked rib is often painful but is not usually serious. Most rib fractures heal on their own over time. However, rib fractures can be more serious if multiple ribs are broken or if broken ribs move out of place and push against other structures or organs. °What are the causes? °This condition is caused by: °· Repetitive movements with high force, such as pitching a baseball or having severe coughing spells. °· A direct blow to the chest, such as a sports injury, a car accident, or a fall. °· Cancer that has spread to the bones, which can weaken bones and cause them to break. °What are the signs or symptoms? °Symptoms of this condition include: °· Pain when you breathe in or cough. °· Pain when someone presses on the injured area. °· Feeling short of breath. °How is this diagnosed? °This condition is diagnosed with a physical exam and medical history. Imaging tests may also be done, such as: °· Chest X-ray. °· CT scan. °· MRI. °· Bone scan. °· Chest ultrasound. °How is this treated? °Treatment for this condition depends on the severity of the fracture. Most rib fractures usually heal on their own in 1-3 months. Sometimes healing takes longer if there is a cough that does not stop or if there are other activities that make the injury worse (aggravating factors). While you heal, you will be given medicines to control the pain. You will also be taught deep breathing exercises. °Severe injuries may require hospitalization or surgery. °Follow these instructions at home: °Managing pain, stiffness, and swelling °· If directed, apply ice to the injured area. °? Put ice in a plastic bag. °? Place a towel between your skin and the bag. °? Leave the ice on for  20 minutes, 2-3 times a day. °· Take over-the-counter and prescription medicines only as told by your health care provider. °Activity °· Avoid a lot of activity and any activities or movements that cause pain. Be careful during activities and avoid bumping the injured rib. °· Slowly increase your activity as told by your health care provider. °General instructions °· Do deep breathing exercises as told by your health care provider. This helps prevent pneumonia, which is a common complication of a broken rib. Your health care provider may instruct you to: °? Take deep breaths several times a day. °? Try to cough several times a day, holding a pillow against the injured area. °? Use a device called incentive spirometer to practice deep breathing several times a day. °· Drink enough fluid to keep your urine pale yellow. °· Do not wear a rib belt or binder. These restrict breathing, which can lead to pneumonia. °· Keep all follow-up visits as told by your health care provider. This is important. °Contact a health care provider if: °· You have a fever. °Get help right away if: °· You have difficulty breathing or you are short of breath. °· You develop a cough that does not stop, or you cough up thick or bloody sputum. °· You have nausea, vomiting, or pain in your abdomen. °· Your pain gets worse and medicine does not help. °Summary °· A rib fracture is a break or crack in one of   the bones of the ribs. °· A broken or cracked rib is often painful but is not usually serious. °· Most rib fractures heal on their own over time. °· Treatment for this condition depends on the severity of the fracture. °· Avoid a lot of activity and any activities or movements that cause pain. °This information is not intended to replace advice given to you by your health care provider. Make sure you discuss any questions you have with your health care provider. °Document Released: 03/15/2005 Document Revised: 02/25/2017 Document Reviewed:  06/14/2016 °Elsevier Patient Education © 2020 [employer] ° ° ° °How To Use an Incentive Spirometer °An incentive spirometer is a tool that measures how well you are filling your lungs with each breath. Learning to take long, deep breaths using this tool can help you keep your lungs clear and active. This may help to reverse or lessen your chance of developing breathing (pulmonary) problems, especially infection. You may be asked to use a spirometer: °· After a surgery. °· If you have a lung problem or a history of smoking. °· After a long period of time when you have been unable to move or be active. °If the spirometer includes an indicator to show the highest number that you have reached, your health care provider or respiratory therapist will help you set a goal. Keep a list (log) of your progress as told by your health care provider. °What are the risks? °· Breathing too quickly may cause dizziness or cause you to pass out. Take your time so you do not get dizzy or light-headed. °· If you are in pain, you may need to take pain medicine before doing incentive spirometry. It is harder to take a deep breath if you are having pain. °How to use your incentive spirometer ° °1. Sit up on the edge of your bed or on a chair. °2. Hold the incentive spirometer so that it is in an upright position. °3. Before you use the spirometer, breathe out normally. °4. Place the mouthpiece in your mouth. Make sure your lips are closed tightly around it. °5. Breathe in slowly and as deeply as you can through your mouth, causing the piston or the ball to rise toward the top of the chamber. °6. Hold your breath for 3-5 seconds, or for as long as possible. °? If the spirometer includes a coach indicator, use this to guide you in breathing. Slow down your breathing if the indicator goes above the marked areas. °7. Remove the mouthpiece from your mouth and breathe out normally. The piston or ball will return to the bottom of the  chamber. °8. Rest for a few seconds, then repeat the steps 10 or more times. °? Take your time and take a few normal breaths between deep breaths so that you do not get dizzy or light-headed. °? Do this every 1-2 hours when you are awake. °9. If the spirometer includes a goal marker to show the highest number you have reached (best effort), use this as a goal to work toward during each repetition. °10. After each set of 10 deep breaths, cough a few times. This will help to make sure that your lungs are clear. °? If you have an incision on your chest or abdomen from surgery, place a pillow or a rolled-up towel firmly against the incision when you cough. This can help to reduce pain from coughing. °General tips °· When you become able to get out of bed, walk around often   and continue to cough to help clear your lungs. °· Keep using the incentive spirometer until your health care provider says it is okay to stop using it. If you have been in the hospital, you may be told to keep using the spirometer at home. °Contact a health care provider if: °· You are having difficulty using the spirometer. °· You have trouble using the spirometer as often as instructed. °· Your pain medicine is not giving enough relief for you to use the spirometer as told. °· You have a fever. °· You develop shortness of breath. °Get help right away if: °· You develop a cough with bloody mucus from the lungs (bloody sputum). °· You have fluid or blood coming from an incision site after you cough. °Summary °· An incentive spirometer is a tool that can help you learn to take long, deep breaths to keep your lungs clear and active. °· You may be asked to use a spirometer after a surgery, if you have a lung problem or a history of smoking, or if you have been inactive for a long period of time. °· Use your incentive spirometer as instructed every 1-2 hours while you are awake. °· If you have an incision on your chest or abdomen, place a pillow or a  rolled-up towel firmly against your incision when you cough. This will help to reduce pain. °This information is not intended to replace advice given to you by your health care provider. Make sure you discuss any questions you have with your health care provider. °Document Released: 07/26/2006 Document Revised: 04/07/2017 Document Reviewed: 01/26/2017 °Elsevier Patient Education © 2020 Elsevier Inc. ° °

## 2019-03-19 ENCOUNTER — Other Ambulatory Visit: Payer: Self-pay | Admitting: Cardiology

## 2019-03-19 DIAGNOSIS — Z20822 Contact with and (suspected) exposure to covid-19: Secondary | ICD-10-CM

## 2019-03-20 LAB — NOVEL CORONAVIRUS, NAA: SARS-CoV-2, NAA: NOT DETECTED

## 2019-09-10 ENCOUNTER — Other Ambulatory Visit: Payer: Self-pay

## 2019-09-10 ENCOUNTER — Encounter (HOSPITAL_COMMUNITY): Payer: Self-pay | Admitting: Emergency Medicine

## 2019-09-10 ENCOUNTER — Emergency Department (HOSPITAL_COMMUNITY)
Admission: EM | Admit: 2019-09-10 | Discharge: 2019-09-10 | Disposition: A | Discharge status: Home / Self Care | Payer: Self-pay | Attending: Emergency Medicine | Admitting: Emergency Medicine

## 2019-09-10 DIAGNOSIS — F17210 Nicotine dependence, cigarettes, uncomplicated: Secondary | ICD-10-CM | POA: Insufficient documentation

## 2019-09-10 DIAGNOSIS — Z79899 Other long term (current) drug therapy: Secondary | ICD-10-CM | POA: Insufficient documentation

## 2019-09-10 DIAGNOSIS — F419 Anxiety disorder, unspecified: Secondary | ICD-10-CM | POA: Insufficient documentation

## 2019-09-10 DIAGNOSIS — I10 Essential (primary) hypertension: Secondary | ICD-10-CM | POA: Insufficient documentation

## 2019-09-10 DIAGNOSIS — J449 Chronic obstructive pulmonary disease, unspecified: Secondary | ICD-10-CM | POA: Insufficient documentation

## 2019-09-10 LAB — BASIC METABOLIC PANEL
Anion gap: 13 (ref 5–15)
BUN: <5 mg/dL — ABNORMAL LOW (ref 6–20)
CO2: 20 mmol/L — ABNORMAL LOW (ref 22–32)
Calcium: 8.4 mg/dL — ABNORMAL LOW (ref 8.9–10.3)
Chloride: 105 mmol/L (ref 98–111)
Creatinine, Ser: 0.6 mg/dL — ABNORMAL LOW (ref 0.61–1.24)
GFR calc Af Amer: >60 mL/min (ref >60)
GFR calc non Af Amer: >60 mL/min (ref >60)
Glucose, Bld: 101 mg/dL — ABNORMAL HIGH (ref 70–99)
Potassium: 3.8 mmol/L (ref 3.5–5.1)
Sodium: 138 mmol/L (ref 135–145)

## 2019-09-10 LAB — CBC WITH DIFFERENTIAL/PLATELET
Abs Immature Granulocytes: 0 10*3/uL (ref 0.00–0.07)
Basophils Absolute: 0 10*3/uL (ref 0.0–0.1)
Basophils Relative: 0 %
Eosinophils Absolute: 0.1 10*3/uL (ref 0.0–0.5)
Eosinophils Relative: 2 %
HCT: 38.4 % — ABNORMAL LOW (ref 39.0–52.0)
Hemoglobin: 12.7 g/dL — ABNORMAL LOW (ref 13.0–17.0)
Lymphocytes Relative: 49 %
Lymphs Abs: 2.2 10*3/uL (ref 0.7–4.0)
MCH: 31.1 pg (ref 26.0–34.0)
MCHC: 33.1 g/dL (ref 30.0–36.0)
MCV: 93.9 fL (ref 80.0–100.0)
Monocytes Absolute: 0.3 10*3/uL (ref 0.1–1.0)
Monocytes Relative: 6 %
Neutro Abs: 1.9 10*3/uL (ref 1.7–7.7)
Neutrophils Relative %: 43 %
Platelets: 254 10*3/uL (ref 150–400)
RBC: 4.09 MIL/uL — ABNORMAL LOW (ref 4.22–5.81)
RDW: 13.3 % (ref 11.5–15.5)
WBC: 4.5 10*3/uL (ref 4.0–10.5)
nRBC: 0 % (ref 0.0–0.2)
nRBC: 0 /100 WBC
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
## 2019-09-10 NOTE — ED Triage Notes (Signed)
Pt BIB [facility], found by his neighbor in the fetal position after mowing. Pt startled easy, highly agitated and given 5mg  IV hadol by EMS. Hx anxiety.

## 2020-01-02 ENCOUNTER — Emergency Department (HOSPITAL_COMMUNITY)
Admission: EM | Admit: 2020-01-02 | Discharge: 2020-01-03 | Disposition: A | Discharge status: Home / Self Care | Payer: Self-pay | Attending: Emergency Medicine | Admitting: Emergency Medicine

## 2020-01-02 ENCOUNTER — Encounter (HOSPITAL_COMMUNITY): Payer: Self-pay

## 2020-01-02 DIAGNOSIS — K2920 Alcoholic gastritis without bleeding: Secondary | ICD-10-CM | POA: Insufficient documentation

## 2020-01-02 DIAGNOSIS — F17210 Nicotine dependence, cigarettes, uncomplicated: Secondary | ICD-10-CM | POA: Insufficient documentation

## 2020-01-02 DIAGNOSIS — J45909 Unspecified asthma, uncomplicated: Secondary | ICD-10-CM | POA: Insufficient documentation

## 2020-01-02 DIAGNOSIS — F10920 Alcohol use, unspecified with intoxication, uncomplicated: Secondary | ICD-10-CM

## 2020-01-02 DIAGNOSIS — F10129 Alcohol abuse with intoxication, unspecified: Secondary | ICD-10-CM | POA: Insufficient documentation

## 2020-01-02 DIAGNOSIS — J449 Chronic obstructive pulmonary disease, unspecified: Secondary | ICD-10-CM | POA: Insufficient documentation

## 2020-01-02 DIAGNOSIS — F1410 Cocaine abuse, uncomplicated: Secondary | ICD-10-CM | POA: Insufficient documentation

## 2020-01-02 DIAGNOSIS — I10 Essential (primary) hypertension: Secondary | ICD-10-CM | POA: Insufficient documentation

## 2020-01-02 DIAGNOSIS — Z79899 Other long term (current) drug therapy: Secondary | ICD-10-CM | POA: Insufficient documentation

## 2020-01-02 DIAGNOSIS — Z7951 Long term (current) use of inhaled steroids: Secondary | ICD-10-CM | POA: Insufficient documentation

## 2020-01-02 LAB — COMPREHENSIVE METABOLIC PANEL
ALT: 48 U/L — ABNORMAL HIGH (ref 0–44)
AST: 97 U/L — ABNORMAL HIGH (ref 15–41)
Albumin: 3.6 g/dL (ref 3.5–5.0)
Alkaline Phosphatase: 73 U/L (ref 38–126)
Anion gap: 14 (ref 5–15)
BUN: <5 mg/dL — ABNORMAL LOW (ref 6–20)
CO2: 19 mmol/L — ABNORMAL LOW (ref 22–32)
Calcium: 9.5 mg/dL (ref 8.9–10.3)
Chloride: 103 mmol/L (ref 98–111)
Creatinine, Ser: 0.71 mg/dL (ref 0.61–1.24)
GFR calc non Af Amer: >60 mL/min (ref >60)
Glucose, Bld: 108 mg/dL — ABNORMAL HIGH (ref 70–99)
Potassium: 3.8 mmol/L (ref 3.5–5.1)
Sodium: 136 mmol/L (ref 135–145)
Total Bilirubin: 0.5 mg/dL (ref 0.3–1.2)
Total Protein: 7.4 g/dL (ref 6.5–8.1)

## 2020-01-02 LAB — CBC
HCT: 32.1 % — ABNORMAL LOW (ref 39.0–52.0)
Hemoglobin: 9.6 g/dL — ABNORMAL LOW (ref 13.0–17.0)
MCH: 22.2 pg — ABNORMAL LOW (ref 26.0–34.0)
MCHC: 29.9 g/dL — ABNORMAL LOW (ref 30.0–36.0)
MCV: 74.3 fL — ABNORMAL LOW (ref 80.0–100.0)
Platelets: 337 10*3/uL (ref 150–400)
RBC: 4.32 MIL/uL (ref 4.22–5.81)
RDW: 15.5 % (ref 11.5–15.5)
WBC: 6.2 10*3/uL (ref 4.0–10.5)
nRBC: 0 % (ref 0.0–0.2)

## 2020-01-02 LAB — URINALYSIS, ROUTINE W REFLEX MICROSCOPIC
Bilirubin Urine: NEGATIVE
Glucose, UA: NEGATIVE mg/dL
Hgb urine dipstick: NEGATIVE
Ketones, ur: NEGATIVE mg/dL
Leukocytes,Ua: NEGATIVE
Nitrite: NEGATIVE
Protein, ur: NEGATIVE mg/dL
Specific Gravity, Urine: 1.001 — ABNORMAL LOW (ref 1.005–1.030)
pH: 5 (ref 5.0–8.0)

## 2020-01-02 LAB — RAPID URINE DRUG SCREEN, HOSP PERFORMED
Amphetamines: NOT DETECTED
Barbiturates: NOT DETECTED
Benzodiazepines: NOT DETECTED
Cocaine: POSITIVE — AB
Opiates: NOT DETECTED
Tetrahydrocannabinol: NOT DETECTED

## 2020-01-02 LAB — LIPASE, BLOOD: Lipase: 43 U/L (ref 11–51)

## 2020-01-02 LAB — ETHANOL: Alcohol, Ethyl (B): 270 mg/dL — ABNORMAL HIGH (ref <10)

## 2020-01-02 NOTE — ED Triage Notes (Signed)
Pt from home with ems c.o abd pain for the past 3 days, hx of alcohol abuse, pt incontinent of urine en route. VSS

## 2020-01-03 MED ORDER — ALBUTEROL SULFATE HFA 108 (90 BASE) MCG/ACT IN AERS
2.0000 | INHALATION_SPRAY | Freq: Once | RESPIRATORY_TRACT | Status: AC
  Administered 2020-01-03: 2 via RESPIRATORY_TRACT
  Filled 2020-01-03: qty 6.7

## 2020-01-03 MED ORDER — PANTOPRAZOLE SODIUM 40 MG PO TBEC
40.0000 mg | DELAYED_RELEASE_TABLET | Freq: Once | ORAL | Status: AC
  Administered 2020-01-03: 40 mg via ORAL
  Filled 2020-01-03: qty 1

## 2020-01-03 MED ORDER — SUCRALFATE 1 G PO TABS: 1.0000 g | ORAL_TABLET | Freq: Three times a day (TID) | ORAL | 0 refills | Status: AC

## 2020-01-03 MED ORDER — LIDOCAINE VISCOUS HCL 2 % MT SOLN
15.0000 mL | Freq: Once | OROMUCOSAL | Status: AC
  Administered 2020-01-03: 15 mL via ORAL
  Filled 2020-01-03: qty 15

## 2020-01-03 MED ORDER — ALUM & MAG HYDROXIDE-SIMETH 200-200-20 MG/5ML PO SUSP
30.0000 mL | Freq: Once | ORAL | Status: AC
  Administered 2020-01-03: 30 mL via ORAL
  Filled 2020-01-03: qty 30

## 2020-01-03 MED ORDER — KETOROLAC TROMETHAMINE 15 MG/ML IJ SOLN
15.0000 mg | Freq: Once | INTRAMUSCULAR | Status: AC
  Administered 2020-01-03: 15 mg via INTRAMUSCULAR
  Filled 2020-01-03: qty 1

## 2020-01-03 MED ORDER — PANTOPRAZOLE SODIUM 20 MG PO TBEC: 20.0000 mg | DELAYED_RELEASE_TABLET | Freq: Every day | ORAL | 1 refills | Status: AC

## 2020-01-03 NOTE — ED Notes (Addendum)
Pa saw the pt before myself see her notes alert
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
## 2020-01-03 NOTE — Discharge Instructions (Signed)
You have been started on Protonix and Carafate.  Take as prescribed for management of symptoms.  We believe that your pain is due to inflammation of your stomach as a result of chronic alcohol consumption.  Try to lessen and eventually discontinue daily drinking.  You would benefit from follow-up with a primary care doctor.  If symptoms persist, follow-up with gastroenterology may be warranted.  Return to the ED for new or concerning symptoms.

## 2021-01-20 ENCOUNTER — Other Ambulatory Visit: Payer: Self-pay | Admitting: Internal Medicine

## 2021-01-22 LAB — LIPID PANEL
Cholesterol: 160 mg/dL (ref <200)
HDL: 45 mg/dL (ref >=40)
LDL Cholesterol (Calc): 91 mg/dL (calc)
Non-HDL Cholesterol (Calc): 115 mg/dL (calc) (ref <130)
Total CHOL/HDL Ratio: 3.6 (calc) (ref <5.0)
Triglycerides: 137 mg/dL (ref <150)

## 2021-01-22 LAB — COMPLETE METABOLIC PANEL WITH GFR
AG Ratio: 1.1 (calc) (ref 1.0–2.5)
ALT: 89 U/L — ABNORMAL HIGH (ref 9–46)
AST: 164 U/L — ABNORMAL HIGH (ref 10–35)
Albumin: 3.7 g/dL (ref 3.6–5.1)
Alkaline phosphatase (APISO): 86 U/L (ref 35–144)
BUN/Creatinine Ratio: 8 (calc) (ref 6–22)
BUN: 5 mg/dL — ABNORMAL LOW (ref 7–25)
CO2: 16 mmol/L — ABNORMAL LOW (ref 20–32)
Calcium: 9.1 mg/dL (ref 8.6–10.3)
Chloride: 105 mmol/L (ref 98–110)
Creat: 0.64 mg/dL — ABNORMAL LOW (ref 0.70–1.35)
Globulin: 3.5 g/dL (calc) (ref 1.9–3.7)
Glucose, Bld: 92 mg/dL (ref 65–99)
Potassium: 4.1 mmol/L (ref 3.5–5.3)
Sodium: 139 mmol/L (ref 135–146)
Total Bilirubin: 0.5 mg/dL (ref 0.2–1.2)
Total Protein: 7.2 g/dL (ref 6.1–8.1)
eGFR: 108 mL/min/{1.73_m2} (ref >=60)

## 2021-01-22 LAB — CBC
HCT: 34.8 % — ABNORMAL LOW (ref 38.5–50.0)
Hemoglobin: 11.4 g/dL — ABNORMAL LOW (ref 13.2–17.1)
MCH: 26.7 pg — ABNORMAL LOW (ref 27.0–33.0)
MCHC: 32.8 g/dL (ref 32.0–36.0)
MCV: 81.5 fL (ref 80.0–100.0)
MPV: 10.1 fL (ref 7.5–12.5)
Platelets: 257 10*3/uL (ref 140–400)
RBC: 4.27 10*6/uL (ref 4.20–5.80)
RDW: 14.2 % (ref 11.0–15.0)
WBC: 4.5 10*3/uL (ref 3.8–10.8)

## 2021-01-22 LAB — PSA: PSA: 1.13 ng/mL (ref <=4.00)

## 2021-01-22 LAB — RHEUMATOID FACTOR: Rheumatoid fact SerPl-aCnc: 21 IU/mL — ABNORMAL HIGH (ref <14)

## 2021-01-22 LAB — VITAMIN D 25 HYDROXY (VIT D DEFICIENCY, FRACTURES): Vit D, 25-Hydroxy: 15 ng/mL — ABNORMAL LOW (ref 30–100)

## 2021-01-22 LAB — TSH: TSH: 3.4 mIU/L (ref 0.40–4.50)

## 2021-01-22 LAB — URIC ACID: Uric Acid, Serum: 5.3 mg/dL (ref 4.0–8.0)

## 2021-01-22 LAB — SEDIMENTATION RATE: Sed Rate: 14 mm/h (ref 0–20)

## 2021-01-22 LAB — ANA: Anti Nuclear Antibody (ANA): NEGATIVE

## 2021-05-04 ENCOUNTER — Inpatient Hospital Stay (HOSPITAL_COMMUNITY)
Admission: EM | Admit: 2021-05-04 | Discharge: 2021-05-10 | DRG: 199 | Discharge status: Against Medical Advice | Payer: Medicaid Other | Attending: Surgery | Admitting: Surgery

## 2021-05-04 ENCOUNTER — Encounter (HOSPITAL_COMMUNITY): Payer: Self-pay

## 2021-05-04 ENCOUNTER — Emergency Department (HOSPITAL_COMMUNITY): Payer: Medicaid Other

## 2021-05-04 DIAGNOSIS — S61411A Laceration without foreign body of right hand, initial encounter: Secondary | ICD-10-CM | POA: Diagnosis present

## 2021-05-04 DIAGNOSIS — S225XXA Flail chest, initial encounter for closed fracture: Secondary | ICD-10-CM | POA: Diagnosis present

## 2021-05-04 DIAGNOSIS — Z5329 Procedure and treatment not carried out because of patient's decision for other reasons: Secondary | ICD-10-CM | POA: Diagnosis present

## 2021-05-04 DIAGNOSIS — F17210 Nicotine dependence, cigarettes, uncomplicated: Secondary | ICD-10-CM | POA: Diagnosis present

## 2021-05-04 DIAGNOSIS — Z7951 Long term (current) use of inhaled steroids: Secondary | ICD-10-CM

## 2021-05-04 DIAGNOSIS — Z20822 Contact with and (suspected) exposure to covid-19: Secondary | ICD-10-CM | POA: Diagnosis present

## 2021-05-04 DIAGNOSIS — S270XXA Traumatic pneumothorax, initial encounter: Principal | ICD-10-CM | POA: Diagnosis present

## 2021-05-04 DIAGNOSIS — T1490XA Injury, unspecified, initial encounter: Secondary | ICD-10-CM

## 2021-05-04 DIAGNOSIS — S27321A Contusion of lung, unilateral, initial encounter: Secondary | ICD-10-CM | POA: Diagnosis present

## 2021-05-04 DIAGNOSIS — F10130 Alcohol abuse with withdrawal, uncomplicated: Secondary | ICD-10-CM | POA: Diagnosis present

## 2021-05-04 DIAGNOSIS — Z8249 Family history of ischemic heart disease and other diseases of the circulatory system: Secondary | ICD-10-CM

## 2021-05-04 DIAGNOSIS — Z79899 Other long term (current) drug therapy: Secondary | ICD-10-CM

## 2021-05-04 DIAGNOSIS — Y92410 Unspecified street and highway as the place of occurrence of the external cause: Secondary | ICD-10-CM

## 2021-05-04 DIAGNOSIS — G43909 Migraine, unspecified, not intractable, without status migrainosus: Secondary | ICD-10-CM | POA: Diagnosis present

## 2021-05-04 DIAGNOSIS — E871 Hypo-osmolality and hyponatremia: Secondary | ICD-10-CM | POA: Diagnosis not present

## 2021-05-04 DIAGNOSIS — K700 Alcoholic fatty liver: Secondary | ICD-10-CM | POA: Diagnosis present

## 2021-05-04 DIAGNOSIS — J449 Chronic obstructive pulmonary disease, unspecified: Secondary | ICD-10-CM | POA: Diagnosis present

## 2021-05-04 DIAGNOSIS — Y908 Blood alcohol level of 240 mg/100 ml or more: Secondary | ICD-10-CM | POA: Diagnosis present

## 2021-05-04 DIAGNOSIS — S2242XA Multiple fractures of ribs, left side, initial encounter for closed fracture: Secondary | ICD-10-CM | POA: Diagnosis present

## 2021-05-04 DIAGNOSIS — Z8042 Family history of malignant neoplasm of prostate: Secondary | ICD-10-CM

## 2021-05-04 DIAGNOSIS — Z825 Family history of asthma and other chronic lower respiratory diseases: Secondary | ICD-10-CM

## 2021-05-04 DIAGNOSIS — I10 Essential (primary) hypertension: Secondary | ICD-10-CM | POA: Diagnosis present

## 2021-05-04 DIAGNOSIS — Z881 Allergy status to other antibiotic agents status: Secondary | ICD-10-CM

## 2021-05-04 LAB — CBC
HCT: 38.9 % — ABNORMAL LOW (ref 39.0–52.0)
Hemoglobin: 12.6 g/dL — ABNORMAL LOW (ref 13.0–17.0)
MCH: 27.3 pg (ref 26.0–34.0)
MCHC: 32.4 g/dL (ref 30.0–36.0)
MCV: 84.4 fL (ref 80.0–100.0)
Platelets: 256 10*3/uL (ref 150–400)
RBC: 4.61 MIL/uL (ref 4.22–5.81)
RDW: 16.2 % — ABNORMAL HIGH (ref 11.5–15.5)
WBC: 7.4 10*3/uL (ref 4.0–10.5)
nRBC: 0 % (ref 0.0–0.2)

## 2021-05-04 LAB — I-STAT CHEM 8, ED
BUN: <3 mg/dL — ABNORMAL LOW (ref 8–23)
Calcium, Ion: 1.13 mmol/L — ABNORMAL LOW (ref 1.15–1.40)
Chloride: 97 mmol/L — ABNORMAL LOW (ref 98–111)
Creatinine, Ser: 0.8 mg/dL (ref 0.61–1.24)
Glucose, Bld: 122 mg/dL — ABNORMAL HIGH (ref 70–99)
HCT: 42 % (ref 39.0–52.0)
Hemoglobin: 14.3 g/dL (ref 13.0–17.0)
Potassium: 3.4 mmol/L — ABNORMAL LOW (ref 3.5–5.1)
Sodium: 137 mmol/L (ref 135–145)
TCO2: 25 mmol/L (ref 22–32)

## 2021-05-04 LAB — URINALYSIS, ROUTINE W REFLEX MICROSCOPIC
Bilirubin Urine: NEGATIVE
Glucose, UA: NEGATIVE mg/dL
Hgb urine dipstick: NEGATIVE
Ketones, ur: NEGATIVE mg/dL
Leukocytes,Ua: NEGATIVE
Nitrite: NEGATIVE
Protein, ur: NEGATIVE mg/dL
Specific Gravity, Urine: >1.046 — ABNORMAL HIGH (ref 1.005–1.030)
pH: 5 (ref 5.0–8.0)

## 2021-05-04 LAB — LACTIC ACID, PLASMA: Lactic Acid, Venous: 3.6 mmol/L (ref 0.5–1.9)

## 2021-05-04 LAB — ETHANOL: Alcohol, Ethyl (B): 271 mg/dL — ABNORMAL HIGH (ref <10)

## 2021-05-04 LAB — PROTIME-INR
INR: 1.1 (ref 0.8–1.2)
Prothrombin Time: 13.7 seconds (ref 11.4–15.2)

## 2021-05-04 LAB — HIV ANTIBODY (ROUTINE TESTING W REFLEX): HIV Screen 4th Generation wRfx: NONREACTIVE

## 2021-05-04 LAB — RESP PANEL BY RT-PCR (FLU A&B, COVID) ARPGX2
Influenza A by PCR: NEGATIVE
Influenza B by PCR: NEGATIVE
SARS Coronavirus 2 by RT PCR: NEGATIVE

## 2021-05-04 MED ORDER — LORAZEPAM 1 MG PO TABS
1.0000 mg | ORAL_TABLET | ORAL | Status: AC | PRN
  Administered 2021-05-05 – 2021-05-07 (×7): 1 mg via ORAL
  Administered 2021-05-07: 2 mg via ORAL
  Filled 2021-05-04 (×3): qty 1
  Filled 2021-05-04: qty 2
  Filled 2021-05-04 (×5): qty 1

## 2021-05-04 MED ORDER — ONDANSETRON HCL 4 MG/2ML IJ SOLN
4.0000 mg | Freq: Four times a day (QID) | INTRAMUSCULAR | Status: DC | PRN
  Administered 2021-05-09: 4 mg via INTRAVENOUS
  Filled 2021-05-04 (×2): qty 2

## 2021-05-04 MED ORDER — LIDOCAINE 5 % EX PTCH
1.0000 | MEDICATED_PATCH | CUTANEOUS | Status: DC
  Administered 2021-05-04 – 2021-05-09 (×5): 1 via TRANSDERMAL
  Filled 2021-05-04 (×5): qty 1

## 2021-05-04 MED ORDER — SODIUM CHLORIDE 0.9 % IV BOLUS
1000.0000 mL | Freq: Once | INTRAVENOUS | Status: AC
  Administered 2021-05-04: 1000 mL via INTRAVENOUS

## 2021-05-04 MED ORDER — THIAMINE HCL 100 MG PO TABS
100.0000 mg | ORAL_TABLET | Freq: Every day | ORAL | Status: DC
  Administered 2021-05-04 – 2021-05-09 (×5): 100 mg via ORAL
  Filled 2021-05-04 (×6): qty 1

## 2021-05-04 MED ORDER — ONDANSETRON 4 MG PO TBDP: 4.0000 mg | ORAL_TABLET | Freq: Four times a day (QID) | ORAL | Status: DC | PRN

## 2021-05-04 MED ORDER — MORPHINE SULFATE (PF) 2 MG/ML IV SOLN
2.0000 mg | INTRAVENOUS | Status: DC | PRN
  Administered 2021-05-04: 4 mg via INTRAVENOUS
  Filled 2021-05-04 (×2): qty 2

## 2021-05-04 MED ORDER — METHOCARBAMOL 500 MG PO TABS
500.0000 mg | ORAL_TABLET | Freq: Four times a day (QID) | ORAL | Status: DC
  Administered 2021-05-04 (×2): 500 mg via ORAL
  Filled 2021-05-04 (×2): qty 1

## 2021-05-04 MED ORDER — ADULT MULTIVITAMIN W/MINERALS CH
1.0000 | ORAL_TABLET | Freq: Every day | ORAL | Status: DC
  Administered 2021-05-04 – 2021-05-09 (×5): 1 via ORAL
  Filled 2021-05-04 (×6): qty 1

## 2021-05-04 MED ORDER — HYDROMORPHONE HCL 1 MG/ML IJ SOLN
1.0000 mg | Freq: Once | INTRAMUSCULAR | Status: AC
  Administered 2021-05-04: 1 mg via INTRAVENOUS
  Filled 2021-05-04: qty 1

## 2021-05-04 MED ORDER — OXYCODONE HCL 5 MG PO TABS
5.0000 mg | ORAL_TABLET | ORAL | Status: DC | PRN
  Administered 2021-05-04 – 2021-05-09 (×12): 10 mg via ORAL
  Filled 2021-05-04 (×14): qty 2
  Filled 2021-05-04: qty 1
  Filled 2021-05-04: qty 2

## 2021-05-04 MED ORDER — IOHEXOL 300 MG/ML  SOLN
100.0000 mL | Freq: Once | INTRAMUSCULAR | Status: AC | PRN
  Administered 2021-05-04: 100 mL via INTRAVENOUS

## 2021-05-04 MED ORDER — FOLIC ACID 1 MG PO TABS
1.0000 mg | ORAL_TABLET | Freq: Every day | ORAL | Status: DC
  Administered 2021-05-04 – 2021-05-09 (×5): 1 mg via ORAL
  Filled 2021-05-04 (×6): qty 1

## 2021-05-04 MED ORDER — LORAZEPAM 2 MG/ML IJ SOLN
1.0000 mg | INTRAMUSCULAR | Status: AC | PRN
  Administered 2021-05-06: 1 mg via INTRAVENOUS
  Administered 2021-05-06: 22:00:00 2 mg via INTRAVENOUS
  Administered 2021-05-06 (×2): 1 mg via INTRAVENOUS
  Filled 2021-05-04 (×3): qty 1
  Filled 2021-05-04: qty 2
  Filled 2021-05-04: qty 1

## 2021-05-04 MED ORDER — ALBUTEROL SULFATE (2.5 MG/3ML) 0.083% IN NEBU
3.0000 mL | INHALATION_SOLUTION | Freq: Four times a day (QID) | RESPIRATORY_TRACT | Status: DC
  Administered 2021-05-04 – 2021-05-05 (×3): 3 mL via RESPIRATORY_TRACT
  Filled 2021-05-04 (×4): qty 3

## 2021-05-04 MED ORDER — SODIUM CHLORIDE 0.9 % IV SOLN: INTRAVENOUS | Status: DC

## 2021-05-04 MED ORDER — SPIRITUS FRUMENTI
1.0000 | Freq: Three times a day (TID) | ORAL | Status: DC
  Administered 2021-05-05 (×2): 1 via ORAL
  Filled 2021-05-04 (×4): qty 1

## 2021-05-04 MED ORDER — ACETAMINOPHEN 500 MG PO TABS
1000.0000 mg | ORAL_TABLET | Freq: Three times a day (TID) | ORAL | Status: DC
  Administered 2021-05-04 – 2021-05-06 (×6): 1000 mg via ORAL
  Filled 2021-05-04 (×6): qty 2

## 2021-05-04 MED ORDER — THIAMINE HCL 100 MG/ML IJ SOLN
100.0000 mg | Freq: Every day | INTRAMUSCULAR | Status: DC
  Filled 2021-05-04: qty 2

## 2021-05-04 MED ORDER — PANTOPRAZOLE SODIUM 40 MG IV SOLR
40.0000 mg | Freq: Every day | INTRAVENOUS | Status: DC
  Filled 2021-05-04: qty 40
  Filled 2021-05-04: qty 10

## 2021-05-04 MED ORDER — METOPROLOL TARTRATE 5 MG/5ML IV SOLN
5.0000 mg | Freq: Four times a day (QID) | INTRAVENOUS | Status: DC | PRN
  Filled 2021-05-04: qty 5

## 2021-05-04 MED ORDER — DOCUSATE SODIUM 100 MG PO CAPS
100.0000 mg | ORAL_CAPSULE | Freq: Two times a day (BID) | ORAL | Status: DC
  Administered 2021-05-04 – 2021-05-09 (×9): 100 mg via ORAL
  Filled 2021-05-04 (×12): qty 1

## 2021-05-04 MED ORDER — PANTOPRAZOLE SODIUM 40 MG PO TBEC
40.0000 mg | DELAYED_RELEASE_TABLET | Freq: Every day | ORAL | Status: DC
  Administered 2021-05-04 – 2021-05-09 (×5): 40 mg via ORAL
  Filled 2021-05-04 (×6): qty 1

## 2021-05-04 MED ORDER — ENOXAPARIN SODIUM 30 MG/0.3ML IJ SOSY
30.0000 mg | PREFILLED_SYRINGE | Freq: Two times a day (BID) | INTRAMUSCULAR | Status: DC
  Administered 2021-05-05 – 2021-05-09 (×10): 30 mg via SUBCUTANEOUS
  Filled 2021-05-04 (×10): qty 0.3

## 2021-05-04 MED ORDER — POLYETHYLENE GLYCOL 3350 17 G PO PACK: 17.0000 g | PACK | Freq: Every day | ORAL | Status: DC | PRN
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
## 2021-05-04 NOTE — ED Notes (Signed)
C-collar in place by EMS. Pt is alert and oriented x 4.

## 2021-05-05 ENCOUNTER — Observation Stay (HOSPITAL_COMMUNITY): Payer: Medicaid Other

## 2021-05-05 DIAGNOSIS — S27321A Contusion of lung, unilateral, initial encounter: Secondary | ICD-10-CM | POA: Diagnosis present

## 2021-05-05 DIAGNOSIS — F10130 Alcohol abuse with withdrawal, uncomplicated: Secondary | ICD-10-CM | POA: Diagnosis present

## 2021-05-05 DIAGNOSIS — Y92410 Unspecified street and highway as the place of occurrence of the external cause: Secondary | ICD-10-CM | POA: Diagnosis not present

## 2021-05-05 DIAGNOSIS — G43909 Migraine, unspecified, not intractable, without status migrainosus: Secondary | ICD-10-CM | POA: Diagnosis present

## 2021-05-05 DIAGNOSIS — F17210 Nicotine dependence, cigarettes, uncomplicated: Secondary | ICD-10-CM | POA: Diagnosis present

## 2021-05-05 DIAGNOSIS — S225XXA Flail chest, initial encounter for closed fracture: Secondary | ICD-10-CM | POA: Diagnosis present

## 2021-05-05 DIAGNOSIS — Z79899 Other long term (current) drug therapy: Secondary | ICD-10-CM | POA: Diagnosis not present

## 2021-05-05 DIAGNOSIS — Z8249 Family history of ischemic heart disease and other diseases of the circulatory system: Secondary | ICD-10-CM | POA: Diagnosis not present

## 2021-05-05 DIAGNOSIS — K700 Alcoholic fatty liver: Secondary | ICD-10-CM | POA: Diagnosis present

## 2021-05-05 DIAGNOSIS — Z7951 Long term (current) use of inhaled steroids: Secondary | ICD-10-CM | POA: Diagnosis not present

## 2021-05-05 DIAGNOSIS — Z8042 Family history of malignant neoplasm of prostate: Secondary | ICD-10-CM | POA: Diagnosis not present

## 2021-05-05 DIAGNOSIS — S61411A Laceration without foreign body of right hand, initial encounter: Secondary | ICD-10-CM | POA: Diagnosis present

## 2021-05-05 DIAGNOSIS — T1490XA Injury, unspecified, initial encounter: Secondary | ICD-10-CM | POA: Diagnosis present

## 2021-05-05 DIAGNOSIS — J449 Chronic obstructive pulmonary disease, unspecified: Secondary | ICD-10-CM | POA: Diagnosis present

## 2021-05-05 DIAGNOSIS — Y908 Blood alcohol level of 240 mg/100 ml or more: Secondary | ICD-10-CM | POA: Diagnosis present

## 2021-05-05 DIAGNOSIS — I10 Essential (primary) hypertension: Secondary | ICD-10-CM | POA: Diagnosis present

## 2021-05-05 DIAGNOSIS — Z5329 Procedure and treatment not carried out because of patient's decision for other reasons: Secondary | ICD-10-CM | POA: Diagnosis present

## 2021-05-05 DIAGNOSIS — S270XXA Traumatic pneumothorax, initial encounter: Secondary | ICD-10-CM | POA: Diagnosis present

## 2021-05-05 DIAGNOSIS — Z881 Allergy status to other antibiotic agents status: Secondary | ICD-10-CM | POA: Diagnosis not present

## 2021-05-05 DIAGNOSIS — Z20822 Contact with and (suspected) exposure to covid-19: Secondary | ICD-10-CM | POA: Diagnosis present

## 2021-05-05 DIAGNOSIS — E871 Hypo-osmolality and hyponatremia: Secondary | ICD-10-CM | POA: Diagnosis not present

## 2021-05-05 DIAGNOSIS — Z825 Family history of asthma and other chronic lower respiratory diseases: Secondary | ICD-10-CM | POA: Diagnosis not present

## 2021-05-05 LAB — BASIC METABOLIC PANEL
Anion gap: 6 (ref 5–15)
BUN: 5 mg/dL — ABNORMAL LOW (ref 8–23)
CO2: 26 mmol/L (ref 22–32)
Calcium: 8.6 mg/dL — ABNORMAL LOW (ref 8.9–10.3)
Chloride: 103 mmol/L (ref 98–111)
Creatinine, Ser: 0.7 mg/dL (ref 0.61–1.24)
GFR, Estimated: >60 mL/min (ref >60)
Glucose, Bld: 117 mg/dL — ABNORMAL HIGH (ref 70–99)
Potassium: 4.7 mmol/L (ref 3.5–5.1)
Sodium: 135 mmol/L (ref 135–145)

## 2021-05-05 LAB — RAPID URINE DRUG SCREEN, HOSP PERFORMED
Amphetamines: NEGATIVE — AB
Barbiturates: NEGATIVE — AB
Benzodiazepines: NEGATIVE — AB
Cocaine: POSITIVE — AB
Opiates: POSITIVE — AB
Tetrahydrocannabinol: NEGATIVE — AB

## 2021-05-05 LAB — CBC
HCT: 34.2 % — ABNORMAL LOW (ref 39.0–52.0)
Hemoglobin: 10.9 g/dL — ABNORMAL LOW (ref 13.0–17.0)
MCH: 27 pg (ref 26.0–34.0)
MCHC: 31.9 g/dL (ref 30.0–36.0)
MCV: 84.9 fL (ref 80.0–100.0)
Platelets: 257 10*3/uL (ref 150–400)
RBC: 4.03 MIL/uL — ABNORMAL LOW (ref 4.22–5.81)
RDW: 16 % — ABNORMAL HIGH (ref 11.5–15.5)
WBC: 5 10*3/uL (ref 4.0–10.5)
nRBC: 0 % (ref 0.0–0.2)

## 2021-05-05 LAB — MAGNESIUM: Magnesium: 1.8 mg/dL (ref 1.7–2.4)

## 2021-05-05 MED ORDER — IPRATROPIUM-ALBUTEROL 0.5-2.5 (3) MG/3ML IN SOLN
3.0000 mL | Freq: Four times a day (QID) | RESPIRATORY_TRACT | Status: DC | PRN
  Administered 2021-05-07 – 2021-05-09 (×2): 3 mL via RESPIRATORY_TRACT
  Filled 2021-05-05 (×2): qty 3

## 2021-05-05 MED ORDER — KETOROLAC TROMETHAMINE 15 MG/ML IJ SOLN
15.0000 mg | Freq: Three times a day (TID) | INTRAMUSCULAR | Status: AC
Start: 2021-05-05 — End: 2021-05-06
  Administered 2021-05-05 – 2021-05-06 (×6): 15 mg via INTRAVENOUS
  Filled 2021-05-05 (×6): qty 1

## 2021-05-05 MED ORDER — METHOCARBAMOL 500 MG PO TABS
1000.0000 mg | ORAL_TABLET | Freq: Three times a day (TID) | ORAL | Status: DC
  Administered 2021-05-05 – 2021-05-09 (×12): 1000 mg via ORAL
  Filled 2021-05-05 (×13): qty 2

## 2021-05-05 MED ORDER — SPIRITUS FRUMENTI
2.0000 | Freq: Three times a day (TID) | ORAL | Status: DC
  Administered 2021-05-05 – 2021-05-09 (×8): 2 via ORAL
  Filled 2021-05-05 (×15): qty 2

## 2021-05-05 MED ORDER — MORPHINE SULFATE (PF) 2 MG/ML IV SOLN
2.0000 mg | INTRAVENOUS | Status: DC | PRN
  Administered 2021-05-06: 2 mg via INTRAVENOUS
  Filled 2021-05-05: qty 1

## 2021-05-05 MED ORDER — ALBUTEROL SULFATE (2.5 MG/3ML) 0.083% IN NEBU
3.0000 mL | INHALATION_SOLUTION | Freq: Four times a day (QID) | RESPIRATORY_TRACT | Status: DC
  Administered 2021-05-05 – 2021-05-09 (×14): 3 mL via RESPIRATORY_TRACT
  Filled 2021-05-05 (×17): qty 3
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
## 2021-05-05 NOTE — TOC CAGE-AID Note (Signed)
Transition of Care [facility]) - CAGE-AID Screening   Patient Details  Name: [name] MRN: [id number] Date of Birth: [birth year]  Transition of [facility]) CM/SW Contact:    Karely Hurtado C Tarpley-Carter, LCSWA Phone Number: 05/05/2021, 9:17 AM   Clinical Narrative: Pt participated in Cage-Aid.  Pt stated he does use substance and ETOH.  Pt was offered resources, due to usage of substance and ETOH.   Shawnee Higham Tarpley-Carter, MSW, LCSW-A Pronouns:  She/Her/Hers [place] Transitions of Care Clinical Social Worker Direct Number:  [phone] [email or web address]    CAGE-AID Screening:    Have You Ever Felt You Ought to Cut Down on Your Drinking or Drug Use?: Yes Have People Annoyed You By Critizing Your Drinking Or Drug Use?: No Have You Felt Bad Or Guilty About Your Drinking Or Drug Use?: No Have You Ever Had a Drink or Used Drugs First Thing In The Morning to Steady Your Nerves or to Get Rid of a Hangover?: No CAGE-AID Score: 1  Substance Abuse Education Offered: Yes  Substance abuse interventions: Transport planner
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
## 2021-05-05 NOTE — Progress Notes (Signed)
Trauma Event Note   Pt's current CIWA = 4 == done by primary RN-- is drinking his 2nd beer- does not seem as jittery, tremors a little better--  Will continue to monitor closely for withdrawal  Last imported Vital Signs BP 112/80 (BP Location: Left Arm)    Pulse 67    Temp 98.2 F (36.8 C) (Oral)    Resp 16    Ht 5\' 7"  (1.702 m)    Wt 175 lb (79.4 kg)    SpO2 96%    BMI 27.41 kg/m   Trending CBC Recent Labs    05/04/21 1349 05/04/21 1419 05/05/21 0346  WBC 7.4  --  5.0  HGB 12.6* 14.3 10.9*  HCT 38.9* 42.0 34.2*  PLT 256  --  257    Trending Coag's Recent Labs    05/04/21 1349  INR 1.1    Trending BMET Recent Labs    05/04/21 1419 05/05/21 0346  NA 137 135  K 3.4* 4.7  CL 97* 103  CO2  --  26  BUN <3* 5*  CREATININE 0.80 0.70  GLUCOSE 122* 117*      [name]  Trauma Response RN  Please call TRN at [phone] for further assistance.

## 2021-05-05 NOTE — ED Notes (Signed)
Breakfast Orders Placed °

## 2021-05-06 LAB — BASIC METABOLIC PANEL
Anion gap: 10 (ref 5–15)
BUN: 7 mg/dL — ABNORMAL LOW (ref 8–23)
CO2: 23 mmol/L (ref 22–32)
Calcium: 8.3 mg/dL — ABNORMAL LOW (ref 8.9–10.3)
Chloride: 99 mmol/L (ref 98–111)
Creatinine, Ser: 0.77 mg/dL (ref 0.61–1.24)
GFR, Estimated: >60 mL/min (ref >60)
Glucose, Bld: 118 mg/dL — ABNORMAL HIGH (ref 70–99)
Potassium: 4.1 mmol/L (ref 3.5–5.1)
Sodium: 132 mmol/L — ABNORMAL LOW (ref 135–145)

## 2021-05-06 LAB — CBC
HCT: 33.3 % — ABNORMAL LOW (ref 39.0–52.0)
Hemoglobin: 10.7 g/dL — ABNORMAL LOW (ref 13.0–17.0)
MCH: 27.1 pg (ref 26.0–34.0)
MCHC: 32.1 g/dL (ref 30.0–36.0)
MCV: 84.3 fL (ref 80.0–100.0)
Platelets: 186 10*3/uL (ref 150–400)
RBC: 3.95 MIL/uL — ABNORMAL LOW (ref 4.22–5.81)
RDW: 15.9 % — ABNORMAL HIGH (ref 11.5–15.5)
WBC: 5 10*3/uL (ref 4.0–10.5)
nRBC: 0 % (ref 0.0–0.2)

## 2021-05-06 MED ORDER — TRAMADOL HCL 50 MG PO TABS
50.0000 mg | ORAL_TABLET | Freq: Four times a day (QID) | ORAL | Status: DC
  Administered 2021-05-06 – 2021-05-10 (×14): 50 mg via ORAL
  Filled 2021-05-06 (×14): qty 1

## 2021-05-06 MED ORDER — ACETAMINOPHEN 500 MG PO TABS
1000.0000 mg | ORAL_TABLET | Freq: Four times a day (QID) | ORAL | Status: DC
  Administered 2021-05-06 – 2021-05-10 (×12): 1000 mg via ORAL
  Filled 2021-05-06 (×13): qty 2

## 2021-05-06 NOTE — Progress Notes (Signed)
Patient is very drowsy, needs constant coaching on getting out of bed.  Required maximum assist transferring from bed to chair.

## 2021-05-06 NOTE — Progress Notes (Signed)
Trauma Event Note   TRN rounded on patient. Woken from sleep, CIWA completed. Patient reports some nausea, no vomiting. Still drinking ethyl alcohol from lunch time, evening dose unopened at bedside. [place] out of nose with room air sats 88%, readjusted with improvement. Course productive cough.  Last imported Vital Signs BP (!) 141/86    Pulse (!) 103    Temp 98.3 F (36.8 C) (Oral)    Resp 16    Ht 5\' 7"  (1.702 m)    Wt 175 lb (79.4 kg)    SpO2 100%    BMI 27.41 kg/m   Trending CBC Recent Labs    05/04/21 1349 05/04/21 1419 05/05/21 0346 05/06/21 0345  WBC 7.4  --  5.0 5.0  HGB 12.6* 14.3 10.9* 10.7*  HCT 38.9* 42.0 34.2* 33.3*  PLT 256  --  257 186    Trending Coag's Recent Labs    05/04/21 1349  INR 1.1    Trending BMET Recent Labs    05/04/21 1419 05/05/21 0346 05/06/21 0345  NA 137 135 132*  K 3.4* 4.7 4.1  CL 97* 103 99  CO2  --  26 23  BUN <3* 5* 7*  CREATININE 0.80 0.70 0.77  GLUCOSE 122* 117* 118*      Neal Oshea O Teigen Parslow  Trauma Response RN  Please call TRN at [phone] for further assistance.

## 2021-05-06 NOTE — Progress Notes (Signed)
Pt slept in brief intervals throughout the night. Requiring ativan at least every 2-3 hours due to increased anxiety and tremors. With CIWA score getting no higher than 7. Frequent complaints of pain to left rib cage-partial effects noted from pain meds administered, pt noted sleeping shortly after med administration. However as soon as he awakens, reports pain. Attempt made to have pt stand to change linen and clothing due to incontinent episode. Pt unable due to pain, pt became increasingly anxious, with increase in pain, and then reports inability to breathe effectively. Pt on continuous pulse ox, with O2 sats 94% on room air. O2 at 2l applied for comfort, also given prn pain medication and additional ativan. Upon reassessment, pt resting quietly. CIWA score at this time is 4.
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
## 2021-05-06 NOTE — Progress Notes (Signed)
Mobility Specialist Progress Note:   05/06/21 1645  Mobility  Activity Transferred from chair to bed  Level of Assistance Moderate assist, patient does 50-74%  Assistive Device None  Distance Ambulated (ft) 2 ft  Activity Response Tolerated fair  $Mobility charge 1 Mobility   ModA to stand from chair and pivot to bed. Left in bed with call bell in reach and all needs met.   [facility] Public librarian Phone [phone]

## 2021-05-06 NOTE — Progress Notes (Signed)
Patient assisted by mobility technician from chair to bed.  Patient has been up in the chair x 4 hours, has been sleeping the whole time he was in the chair.  Patient encouraged to eat lunch, only took one bite due to sleepiness.  Ethyl alcohol given as ordered, patient holds bottle but sleeps over it. Two cans of ethyl alcohol from lunch at bedside open but still full.  No distress at this time, needs addressed.
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
## 2021-05-07 ENCOUNTER — Inpatient Hospital Stay (HOSPITAL_COMMUNITY): Payer: Medicaid Other

## 2021-05-07 ENCOUNTER — Encounter: Payer: Self-pay | Admitting: Internal Medicine

## 2021-05-07 MED ORDER — LORAZEPAM 2 MG/ML IJ SOLN
1.0000 mg | INTRAMUSCULAR | Status: DC | PRN
  Administered 2021-05-07: 2 mg via INTRAVENOUS
  Administered 2021-05-07: 4 mg via INTRAVENOUS
  Administered 2021-05-07: 2 mg via INTRAVENOUS
  Filled 2021-05-07: qty 2

## 2021-05-07 MED ORDER — LORAZEPAM 1 MG PO TABS: 1.0000 mg | ORAL_TABLET | ORAL | Status: DC | PRN

## 2021-05-07 MED ORDER — METHOCARBAMOL 1000 MG/10ML IJ SOLN
500.0000 mg | Freq: Three times a day (TID) | INTRAVENOUS | Status: DC | PRN
  Administered 2021-05-08: 500 mg via INTRAVENOUS
  Filled 2021-05-07: qty 5
  Filled 2021-05-07: qty 500

## 2021-05-07 MED ORDER — HYDROMORPHONE HCL 1 MG/ML IJ SOLN
1.0000 mg | INTRAMUSCULAR | Status: DC | PRN
  Administered 2021-05-07 – 2021-05-09 (×10): 1 mg via INTRAVENOUS
  Filled 2021-05-07 (×10): qty 1

## 2021-05-07 MED ORDER — ORAL CARE MOUTH RINSE
15.0000 mL | Freq: Two times a day (BID) | OROMUCOSAL | Status: DC
  Administered 2021-05-08 – 2021-05-09 (×5): 15 mL via OROMUCOSAL

## 2021-05-07 MED ORDER — ACETAMINOPHEN 10 MG/ML IV SOLN
1000.0000 mg | Freq: Once | INTRAVENOUS | Status: AC
  Administered 2021-05-08: 1000 mg via INTRAVENOUS
  Filled 2021-05-07: qty 100

## 2021-05-07 MED ORDER — CHLORHEXIDINE GLUCONATE CLOTH 2 % EX PADS
6.0000 | MEDICATED_PAD | Freq: Every day | CUTANEOUS | Status: DC
  Administered 2021-05-08: 6 via TOPICAL

## 2021-05-07 MED ORDER — DEXMEDETOMIDINE HCL IN NACL 400 MCG/100ML IV SOLN
0.2000 ug/kg/h | INTRAVENOUS | Status: DC
  Administered 2021-05-07: 0.2 ug/kg/h via INTRAVENOUS
  Filled 2021-05-07: qty 100
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
## 2021-05-07 NOTE — Progress Notes (Signed)
Called Rapid Response Nurse, Marcellina Millin about pt condition. Respirations at 32, on 3L [place], pt becoming more agitated again. PCXR completed.

## 2021-05-07 NOTE — TOC Progression Note (Signed)
Transition of Care [facility]) - Progression Note    Patient Details  Name: [name] MRN: [id number] Date of Birth: 05/29/1960  [facility]) CM/SW Contact  Glennon Mac, RN Phone Number: 05/07/2021, 1032am Clinical Narrative:    Spoke with patient by phone regarding possible home therapies.  He states, "I don't need any physical therapy, I'll be fine."  Await PT session today; will follow for discharge needs as patient progresses.    Expected Discharge Plan: Home w Home Health Services Barriers to Discharge: Continued Medical Work up  Expected Discharge Plan and Services Expected Discharge Plan: Home w Home Health Services   Discharge Planning Services: CM Consult   Living arrangements for the past 2 months: Mobile Home                                       Social Determinants of Health (SDOH) Interventions    Readmission Risk Interventions No flowsheet data found.  Quintella Baton, RN, BSN  Trauma/Neuro ICU Case Manager [phone]
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
## 2021-05-07 NOTE — Progress Notes (Signed)
°   05/07/21 1600  Assess: MEWS Score  Pulse Rate (!) 110  Resp (!) 30  SpO2 92 %  O2 Device Nasal Cannula  O2 Flow Rate (L/min) 2 L/min  Assess: MEWS Score  MEWS Temp 0  MEWS Systolic 0  MEWS Pulse 1  MEWS RR 2  MEWS LOC 0  MEWS Score 3  MEWS Score Color Yellow  Assess: if the MEWS score is Yellow or Red  Were vital signs taken at a resting state? Yes  Focused Assessment Change from prior assessment (see assessment flowsheet)  Early Detection of Sepsis Score *See Row Information* Low  MEWS guidelines implemented *See Row Information* Yes  Treat  MEWS Interventions Administered scheduled meds/treatments;Administered prn meds/treatments;Escalated (See documentation below)  Pain Scale Faces  Pain Score 6  Take Vital Signs  Increase Vital Sign Frequency  Yellow: Q 2hr X 2 then Q 4hr X 2, if remains yellow, continue Q 4hrs  Escalate  MEWS: Escalate Yellow: discuss with charge nurse/RN and consider discussing with provider and RRT  Notify: Charge Nurse/RN  Name of Charge Nurse/RN Notified Aurea Graff RN/Katy RN Trauma  Date Charge Nurse/RN Notified 05/07/21  Time Charge Nurse/RN Notified 1820  Notify: Provider  Provider Name/Title [facility]  Date Provider Notified 05/07/21  Time Provider Notified 1825  Notification Type Page  Notification Reason Change in status  Provider response See new orders  Date of Provider Response 05/07/21  Time of Provider Response 1825

## 2021-05-08 LAB — CBC
HCT: 38.1 % — ABNORMAL LOW (ref 39.0–52.0)
Hemoglobin: 11.8 g/dL — ABNORMAL LOW (ref 13.0–17.0)
MCH: 27.4 pg (ref 26.0–34.0)
MCHC: 31 g/dL (ref 30.0–36.0)
MCV: 88.4 fL (ref 80.0–100.0)
Platelets: UNDETERMINED 10*3/uL (ref 150–400)
RBC: 4.31 MIL/uL (ref 4.22–5.81)
RDW: 16.7 % — ABNORMAL HIGH (ref 11.5–15.5)
WBC: 7.1 10*3/uL (ref 4.0–10.5)
nRBC: 0 % (ref 0.0–0.2)

## 2021-05-08 LAB — BASIC METABOLIC PANEL
Anion gap: 10 (ref 5–15)
BUN: 6 mg/dL — ABNORMAL LOW (ref 8–23)
CO2: 21 mmol/L — ABNORMAL LOW (ref 22–32)
Calcium: 8.3 mg/dL — ABNORMAL LOW (ref 8.9–10.3)
Chloride: 103 mmol/L (ref 98–111)
Creatinine, Ser: 0.65 mg/dL (ref 0.61–1.24)
GFR, Estimated: >60 mL/min (ref >60)
Glucose, Bld: 97 mg/dL (ref 70–99)
Potassium: 4.6 mmol/L (ref 3.5–5.1)
Sodium: 134 mmol/L — ABNORMAL LOW (ref 135–145)

## 2021-05-08 MED ORDER — LIP MEDEX EX OINT
TOPICAL_OINTMENT | CUTANEOUS | Status: DC | PRN
  Filled 2021-05-08: qty 7

## 2021-05-08 MED ORDER — SODIUM CHLORIDE 0.9 % IV SOLN
2.0000 g | Freq: Every day | INTRAVENOUS | Status: DC
  Administered 2021-05-08 – 2021-05-09 (×2): 2 g via INTRAVENOUS
  Filled 2021-05-08 (×2): qty 20

## 2021-05-08 MED ORDER — CHLORDIAZEPOXIDE HCL 5 MG PO CAPS
10.0000 mg | ORAL_CAPSULE | Freq: Three times a day (TID) | ORAL | Status: DC
  Administered 2021-05-08 – 2021-05-09 (×6): 10 mg via ORAL
  Filled 2021-05-08 (×6): qty 2
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
## 2021-05-08 NOTE — TOC Progression Note (Signed)
Transition of Care [facility] ) - Progression Note    Patient Details  Name: [name] MRN: [id number] Date of Birth: 1960-06-24  [facility] CM/SW Contact  Glennon Mac, RN Phone Number: 05/08/2021, 3:17 PM  Clinical Narrative:    Noted patient's need for transfer to ICU last evening for IV precedex. PT/OT continue to recommend SNF at discharge, but SNF placement is unlikely due to Medicaid as only payor and ETOH abuse. Hopeful for progress with therapies once withdrawal s/s improve.      Expected Discharge Plan: Home w Home Health Services Barriers to Discharge: Continued Medical Work up  Expected Discharge Plan and Services Expected Discharge Plan: Home w Home Health Services   Discharge Planning Services: CM Consult   Living arrangements for the past 2 months: Mobile Home                                       Social Determinants of Health (SDOH) Interventions    Readmission Risk Interventions No flowsheet data found.  Quintella Baton, RN, BSN  Trauma/Neuro ICU Case Manager [phone]

## 2021-05-09 LAB — CBC
HCT: 31.7 % — ABNORMAL LOW (ref 39.0–52.0)
Hemoglobin: 10.2 g/dL — ABNORMAL LOW (ref 13.0–17.0)
MCH: 27.4 pg (ref 26.0–34.0)
MCHC: 32.2 g/dL (ref 30.0–36.0)
MCV: 85.2 fL (ref 80.0–100.0)
Platelets: 221 10*3/uL (ref 150–400)
RBC: 3.72 MIL/uL — ABNORMAL LOW (ref 4.22–5.81)
RDW: 16.8 % — ABNORMAL HIGH (ref 11.5–15.5)
WBC: 6.6 10*3/uL (ref 4.0–10.5)
nRBC: 0 % (ref 0.0–0.2)

## 2021-05-09 LAB — BASIC METABOLIC PANEL
Anion gap: 9 (ref 5–15)
BUN: 7 mg/dL — ABNORMAL LOW (ref 8–23)
CO2: 22 mmol/L (ref 22–32)
Calcium: 8.1 mg/dL — ABNORMAL LOW (ref 8.9–10.3)
Chloride: 105 mmol/L (ref 98–111)
Creatinine, Ser: 0.68 mg/dL (ref 0.61–1.24)
GFR, Estimated: >60 mL/min (ref >60)
Glucose, Bld: 93 mg/dL (ref 70–99)
Potassium: 3.4 mmol/L — ABNORMAL LOW (ref 3.5–5.1)
Sodium: 136 mmol/L (ref 135–145)

## 2021-05-09 LAB — EXPECTORATED SPUTUM ASSESSMENT W GRAM STAIN, RFLX TO RESP C

## 2021-05-09 MED ORDER — POTASSIUM CHLORIDE CRYS ER 20 MEQ PO TBCR
40.0000 meq | EXTENDED_RELEASE_TABLET | Freq: Once | ORAL | Status: AC
  Administered 2021-05-09: 20 meq via ORAL
  Filled 2021-05-09: qty 1
  Filled 2021-05-09: qty 2

## 2021-05-09 MED ORDER — DM-GUAIFENESIN ER 30-600 MG PO TB12
1.0000 | ORAL_TABLET | Freq: Two times a day (BID) | ORAL | Status: DC
  Administered 2021-05-09: 1 via ORAL
  Filled 2021-05-09 (×2): qty 1

## 2021-05-09 MED ORDER — ALBUTEROL SULFATE (2.5 MG/3ML) 0.083% IN NEBU: 3.0000 mL | INHALATION_SOLUTION | RESPIRATORY_TRACT | Status: DC | PRN
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
## 2021-05-10 LAB — CBC
HCT: 33.2 % — ABNORMAL LOW (ref 39.0–52.0)
Hemoglobin: 10.7 g/dL — ABNORMAL LOW (ref 13.0–17.0)
MCH: 27.2 pg (ref 26.0–34.0)
MCHC: 32.2 g/dL (ref 30.0–36.0)
MCV: 84.5 fL (ref 80.0–100.0)
Platelets: 244 10*3/uL (ref 150–400)
RBC: 3.93 MIL/uL — ABNORMAL LOW (ref 4.22–5.81)
RDW: 16.9 % — ABNORMAL HIGH (ref 11.5–15.5)
WBC: 6.6 10*3/uL (ref 4.0–10.5)
nRBC: 0 % (ref 0.0–0.2)

## 2021-05-10 LAB — BASIC METABOLIC PANEL
Anion gap: 11 (ref 5–15)
BUN: 6 mg/dL — ABNORMAL LOW (ref 8–23)
CO2: 20 mmol/L — ABNORMAL LOW (ref 22–32)
Calcium: 8.2 mg/dL — ABNORMAL LOW (ref 8.9–10.3)
Chloride: 103 mmol/L (ref 98–111)
Creatinine, Ser: 0.65 mg/dL (ref 0.61–1.24)
GFR, Estimated: >60 mL/min (ref >60)
Glucose, Bld: 89 mg/dL (ref 70–99)
Potassium: 4 mmol/L (ref 3.5–5.1)
Sodium: 134 mmol/L — ABNORMAL LOW (ref 135–145)
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
## 2021-05-12 LAB — CULTURE, RESPIRATORY W GRAM STAIN: Culture: NORMAL
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
## 2022-10-06 ENCOUNTER — Other Ambulatory Visit: Payer: Self-pay | Admitting: Internal Medicine

## 2022-10-07 LAB — COMPLETE METABOLIC PANEL WITHOUT GFR
AG Ratio: 1.1 (calc) (ref 1.0–2.5)
ALT: 50 U/L — ABNORMAL HIGH (ref 9–46)
AST: 82 U/L — ABNORMAL HIGH (ref 10–35)
Albumin: 4 g/dL (ref 3.6–5.1)
Alkaline phosphatase (APISO): 80 U/L (ref 35–144)
BUN/Creatinine Ratio: 13 (calc) (ref 6–22)
BUN: 8 mg/dL (ref 7–25)
CO2: 17 mmol/L — ABNORMAL LOW (ref 20–32)
Calcium: 9.3 mg/dL (ref 8.6–10.3)
Chloride: 102 mmol/L (ref 98–110)
Creat: 0.6 mg/dL — ABNORMAL LOW (ref 0.70–1.35)
Globulin: 3.7 g/dL (ref 1.9–3.7)
Glucose, Bld: 76 mg/dL (ref 65–99)
Potassium: 4.1 mmol/L (ref 3.5–5.3)
Sodium: 134 mmol/L — ABNORMAL LOW (ref 135–146)
Total Bilirubin: 0.7 mg/dL (ref 0.2–1.2)
Total Protein: 7.7 g/dL (ref 6.1–8.1)
eGFR: 108 mL/min/1.73m2 (ref >=60)

## 2022-10-07 LAB — LIPID PANEL
Cholesterol: 117 mg/dL (ref <200)
HDL: 33 mg/dL — ABNORMAL LOW (ref >=40)
LDL Cholesterol (Calc): 65 mg/dL
Non-HDL Cholesterol (Calc): 84 mg/dL (ref <130)
Total CHOL/HDL Ratio: 3.5 (calc) (ref <5.0)
Triglycerides: 102 mg/dL (ref <150)

## 2022-10-07 LAB — CBC
HCT: 35.3 % — ABNORMAL LOW (ref 38.5–50.0)
Hemoglobin: 9.8 g/dL — ABNORMAL LOW (ref 13.2–17.1)
MCH: 20.9 pg — ABNORMAL LOW (ref 27.0–33.0)
MCHC: 27.8 g/dL — ABNORMAL LOW (ref 32.0–36.0)
MCV: 75.1 fL — ABNORMAL LOW (ref 80.0–100.0)
MPV: 9.2 fL (ref 7.5–12.5)
Platelets: 294 Thousand/uL (ref 140–400)
RBC: 4.7 Million/uL (ref 4.20–5.80)
RDW: 16 % — ABNORMAL HIGH (ref 11.0–15.0)
WBC: 5.2 Thousand/uL (ref 3.8–10.8)

## 2022-10-07 LAB — VITAMIN D 25 HYDROXY (VIT D DEFICIENCY, FRACTURES): Vit D, 25-Hydroxy: 33 ng/mL (ref 30–100)

## 2022-10-07 LAB — TSH: TSH: 3.54 m[IU]/L (ref 0.40–4.50)

## 2022-10-07 LAB — PSA: PSA: 0.74 ng/mL (ref <=4.00)

## 2022-10-12 ENCOUNTER — Encounter (HOSPITAL_COMMUNITY): Payer: Self-pay

## 2022-10-12 ENCOUNTER — Emergency Department (HOSPITAL_COMMUNITY)
Admission: EM | Admit: 2022-10-12 | Discharge: 2022-10-12 | Disposition: A | Discharge status: Home / Self Care | Payer: Medicaid Other | Attending: Emergency Medicine | Admitting: Emergency Medicine

## 2022-10-12 ENCOUNTER — Emergency Department (HOSPITAL_COMMUNITY): Payer: Medicaid Other

## 2022-10-12 ENCOUNTER — Other Ambulatory Visit: Payer: Self-pay

## 2022-10-12 DIAGNOSIS — J449 Chronic obstructive pulmonary disease, unspecified: Secondary | ICD-10-CM | POA: Diagnosis not present

## 2022-10-12 DIAGNOSIS — I10 Essential (primary) hypertension: Secondary | ICD-10-CM | POA: Diagnosis not present

## 2022-10-12 DIAGNOSIS — S8011XA Contusion of right lower leg, initial encounter: Secondary | ICD-10-CM | POA: Diagnosis not present

## 2022-10-12 DIAGNOSIS — Y92410 Unspecified street and highway as the place of occurrence of the external cause: Secondary | ICD-10-CM | POA: Insufficient documentation

## 2022-10-12 DIAGNOSIS — M7989 Other specified soft tissue disorders: Secondary | ICD-10-CM

## 2022-10-12 DIAGNOSIS — Y904 Blood alcohol level of 80-99 mg/100 ml: Secondary | ICD-10-CM | POA: Insufficient documentation

## 2022-10-12 DIAGNOSIS — M79661 Pain in right lower leg: Secondary | ICD-10-CM | POA: Diagnosis present

## 2022-10-12 LAB — COMPREHENSIVE METABOLIC PANEL
ALT: 43 U/L (ref 0–44)
AST: 74 U/L — ABNORMAL HIGH (ref 15–41)
Albumin: 3 g/dL — ABNORMAL LOW (ref 3.5–5.0)
Alkaline Phosphatase: 73 U/L (ref 38–126)
Anion gap: 17 — ABNORMAL HIGH (ref 5–15)
BUN: 8 mg/dL (ref 8–23)
CO2: 18 mmol/L — ABNORMAL LOW (ref 22–32)
Calcium: 8.7 mg/dL — ABNORMAL LOW (ref 8.9–10.3)
Chloride: 101 mmol/L (ref 98–111)
Creatinine, Ser: 0.86 mg/dL (ref 0.61–1.24)
GFR, Estimated: >60 mL/min (ref >60)
Glucose, Bld: 97 mg/dL (ref 70–99)
Potassium: 3.9 mmol/L (ref 3.5–5.1)
Sodium: 136 mmol/L (ref 135–145)
Total Bilirubin: 0.3 mg/dL (ref 0.3–1.2)
Total Protein: 6.9 g/dL (ref 6.5–8.1)

## 2022-10-12 LAB — CBC WITH DIFFERENTIAL/PLATELET
Abs Immature Granulocytes: 0.02 10*3/uL (ref 0.00–0.07)
Basophils Absolute: 0.1 10*3/uL (ref 0.0–0.1)
Basophils Relative: 1 %
Eosinophils Absolute: 0.2 10*3/uL (ref 0.0–0.5)
Eosinophils Relative: 3 %
HCT: 29.6 % — ABNORMAL LOW (ref 39.0–52.0)
Hemoglobin: 8.7 g/dL — ABNORMAL LOW (ref 13.0–17.0)
Immature Granulocytes: 0 %
Lymphocytes Relative: 22 %
Lymphs Abs: 1.3 10*3/uL (ref 0.7–4.0)
MCH: 21.3 pg — ABNORMAL LOW (ref 26.0–34.0)
MCHC: 29.4 g/dL — ABNORMAL LOW (ref 30.0–36.0)
MCV: 72.4 fL — ABNORMAL LOW (ref 80.0–100.0)
Monocytes Absolute: 0.5 10*3/uL (ref 0.1–1.0)
Monocytes Relative: 9 %
Neutro Abs: 3.7 10*3/uL (ref 1.7–7.7)
Neutrophils Relative %: 65 %
Platelets: 329 10*3/uL (ref 150–400)
RBC: 4.09 MIL/uL — ABNORMAL LOW (ref 4.22–5.81)
RDW: 18.3 % — ABNORMAL HIGH (ref 11.5–15.5)
WBC: 5.7 10*3/uL (ref 4.0–10.5)
nRBC: 0 % (ref 0.0–0.2)

## 2022-10-12 LAB — ETHANOL: Alcohol, Ethyl (B): 81 mg/dL — ABNORMAL HIGH (ref <10)

## 2022-10-12 LAB — CK: Total CK: 43 U/L — ABNORMAL LOW (ref 49–397)

## 2022-10-12 MED ORDER — OXYCODONE HCL 5 MG PO TABS: 5.0000 mg | ORAL_TABLET | Freq: Four times a day (QID) | ORAL | 0 refills | Status: DC | PRN
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
## 2022-10-12 NOTE — Progress Notes (Signed)
Right lower extremity venous study completed.   Preliminary results relayed to PA in ER.  Please see CV Procedures for preliminary results.  Maat Kafer, RVT  3:54 PM 10/12/22
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
## 2022-10-12 NOTE — Discharge Instructions (Addendum)
No broken bones today.  Make sure you are elevating your leg and resting.

## 2022-12-20 ENCOUNTER — Emergency Department (HOSPITAL_COMMUNITY)
Admission: EM | Admit: 2022-12-20 | Discharge: 2022-12-21 | Disposition: A | Discharge status: Home / Self Care | Payer: Medicaid Other | Attending: Emergency Medicine | Admitting: Emergency Medicine

## 2022-12-20 ENCOUNTER — Emergency Department (HOSPITAL_COMMUNITY): Payer: Medicaid Other

## 2022-12-20 DIAGNOSIS — Y92410 Unspecified street and highway as the place of occurrence of the external cause: Secondary | ICD-10-CM | POA: Insufficient documentation

## 2022-12-20 DIAGNOSIS — I10 Essential (primary) hypertension: Secondary | ICD-10-CM | POA: Diagnosis not present

## 2022-12-20 DIAGNOSIS — J449 Chronic obstructive pulmonary disease, unspecified: Secondary | ICD-10-CM | POA: Insufficient documentation

## 2022-12-20 DIAGNOSIS — S2242XA Multiple fractures of ribs, left side, initial encounter for closed fracture: Secondary | ICD-10-CM | POA: Diagnosis not present

## 2022-12-20 DIAGNOSIS — R0781 Pleurodynia: Secondary | ICD-10-CM | POA: Diagnosis present

## 2022-12-20 MED ORDER — KETOROLAC TROMETHAMINE 15 MG/ML IJ SOLN
15.0000 mg | Freq: Once | INTRAMUSCULAR | Status: AC
  Administered 2022-12-20: 15 mg via INTRAMUSCULAR
  Filled 2022-12-20: qty 1

## 2022-12-20 MED ORDER — OXYCODONE-ACETAMINOPHEN 5-325 MG PO TABS
2.0000 | ORAL_TABLET | Freq: Once | ORAL | Status: AC
  Administered 2022-12-20: 2 via ORAL
  Filled 2022-12-20: qty 2
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
## 2022-12-21 MED ORDER — OXYCODONE HCL 5 MG PO TABS: 5.0000 mg | ORAL_TABLET | Freq: Four times a day (QID) | ORAL | 0 refills | Status: AC | PRN

## 2022-12-21 MED ORDER — NAPROXEN 500 MG PO TABS: 500.0000 mg | ORAL_TABLET | Freq: Two times a day (BID) | ORAL | 0 refills | Status: AC
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
## 2023-10-25 ENCOUNTER — Other Ambulatory Visit: Payer: Self-pay | Admitting: Family Medicine

## 2023-10-25 DIAGNOSIS — Z122 Encounter for screening for malignant neoplasm of respiratory organs: Secondary | ICD-10-CM

## 2023-11-08 ENCOUNTER — Inpatient Hospital Stay: Admission: RE | Admit: 2023-11-08 | Source: Ambulatory Visit

## 2024-01-23 ENCOUNTER — Other Ambulatory Visit: Payer: Self-pay | Admitting: Family Medicine

## 2024-01-23 DIAGNOSIS — B1920 Unspecified viral hepatitis C without hepatic coma: Secondary | ICD-10-CM

## 2024-01-23 DIAGNOSIS — R748 Abnormal levels of other serum enzymes: Secondary | ICD-10-CM

## 2024-01-26 ENCOUNTER — Other Ambulatory Visit
